# Patient Record
Sex: Male | Born: 1960 | Race: White | Hispanic: No | Marital: Married | Smoking: Never smoker
Health system: Southern US, Community
[De-identification: ages and names within clinical notes are randomized; demographics above are authoritative.]

## PROBLEM LIST (undated history)

## (undated) DIAGNOSIS — N200 Calculus of kidney: Secondary | ICD-10-CM

---

## 2005-10-11 ENCOUNTER — Ambulatory Visit: Payer: Self-pay | Admitting: Internal Medicine

## 2006-12-26 ENCOUNTER — Encounter: Payer: Self-pay | Admitting: Internal Medicine

## 2006-12-26 DIAGNOSIS — Z860100 Personal history of colon polyps, unspecified: Secondary | ICD-10-CM | POA: Insufficient documentation

## 2011-12-21 ENCOUNTER — Emergency Department (HOSPITAL_COMMUNITY)
Admission: EM | Admit: 2011-12-21 | Discharge: 2011-12-22 | Disposition: A | Discharge status: Home / Self Care | Payer: Self-pay | Attending: Emergency Medicine | Admitting: Emergency Medicine

## 2011-12-21 ENCOUNTER — Encounter (HOSPITAL_COMMUNITY): Payer: Self-pay | Admitting: *Deleted

## 2011-12-21 DIAGNOSIS — N23 Unspecified renal colic: Secondary | ICD-10-CM | POA: Insufficient documentation

## 2011-12-21 DIAGNOSIS — R112 Nausea with vomiting, unspecified: Secondary | ICD-10-CM | POA: Insufficient documentation

## 2011-12-21 DIAGNOSIS — Z87442 Personal history of urinary calculi: Secondary | ICD-10-CM | POA: Insufficient documentation

## 2011-12-21 DIAGNOSIS — R109 Unspecified abdominal pain: Secondary | ICD-10-CM | POA: Insufficient documentation

## 2011-12-21 HISTORY — DX: Calculus of kidney: N20.0

## 2011-12-21 LAB — CBC
HCT: 42.5 % (ref 39.0–52.0)
MCV: 85.9 fL (ref 78.0–100.0)
RBC: 4.95 MIL/uL (ref 4.22–5.81)
WBC: 18.6 10*3/uL — ABNORMAL HIGH (ref 4.0–10.5)

## 2011-12-21 LAB — URINALYSIS, ROUTINE W REFLEX MICROSCOPIC
Bilirubin Urine: NEGATIVE
Nitrite: NEGATIVE
Specific Gravity, Urine: 1.005 (ref 1.005–1.030)
pH: 6 (ref 5.0–8.0)

## 2011-12-21 LAB — COMPREHENSIVE METABOLIC PANEL
ALT: 32 U/L (ref 0–53)
Albumin: 4.1 g/dL (ref 3.5–5.2)
Alkaline Phosphatase: 82 U/L (ref 39–117)
BUN: 10 mg/dL (ref 6–23)
Chloride: 98 mEq/L (ref 96–112)
Potassium: 3.4 mEq/L — ABNORMAL LOW (ref 3.5–5.1)
Total Bilirubin: 1 mg/dL (ref 0.3–1.2)

## 2011-12-21 LAB — URINE MICROSCOPIC-ADD ON

## 2011-12-21 NOTE — ED Notes (Signed)
Pt self ambulated to restroom. When pt returned to room, pt stated that he passed blood in urine.

## 2011-12-21 NOTE — ED Notes (Signed)
Patient was diagnosed with kidney stone on Sunday in [place] and now he is complaining of lower abdominal pain.  Patient is also experiencing nausea and vomiting

## 2011-12-21 NOTE — ED Notes (Signed)
PT reported increased ABD. But Pt observed eating chips and drinking soda. Pt requested not to eat

## 2011-12-22 ENCOUNTER — Emergency Department (HOSPITAL_COMMUNITY): Payer: Self-pay

## 2011-12-22 ENCOUNTER — Encounter (HOSPITAL_COMMUNITY): Payer: Self-pay | Admitting: Radiology

## 2011-12-22 MED ORDER — SENNOSIDES-DOCUSATE SODIUM 8.6-50 MG PO TABS: 1.0000 | ORAL_TABLET | Freq: Every day | ORAL | Status: DC

## 2011-12-22 MED ORDER — ONDANSETRON HCL 4 MG/2ML IJ SOLN
4.0000 mg | Freq: Once | INTRAMUSCULAR | Status: AC
  Administered 2011-12-22: 4 mg via INTRAVENOUS
  Filled 2011-12-22: qty 2

## 2011-12-22 MED ORDER — SODIUM CHLORIDE 0.9 % IV BOLUS (SEPSIS)
1000.0000 mL | Freq: Once | INTRAVENOUS | Status: AC
  Administered 2011-12-22: 1000 mL via INTRAVENOUS

## 2011-12-22 MED ORDER — ONDANSETRON HCL 4 MG PO TABS: 4.0000 mg | ORAL_TABLET | Freq: Four times a day (QID) | ORAL | Status: DC

## 2011-12-22 MED ORDER — MORPHINE SULFATE 4 MG/ML IJ SOLN
4.0000 mg | Freq: Once | INTRAMUSCULAR | Status: AC
  Administered 2011-12-22: 4 mg via INTRAVENOUS
  Filled 2011-12-22: qty 1

## 2011-12-22 MED ORDER — TAMSULOSIN HCL 0.4 MG PO CAPS: 0.4000 mg | ORAL_CAPSULE | Freq: Every day | ORAL | Status: DC

## 2011-12-22 MED ORDER — DEXTROSE 5 % IV SOLN
1.0000 g | Freq: Once | INTRAVENOUS | Status: AC
  Administered 2011-12-22: 1 g via INTRAVENOUS
  Filled 2011-12-22: qty 10

## 2011-12-22 MED ORDER — CIPROFLOXACIN HCL 500 MG PO TABS: 500.0000 mg | ORAL_TABLET | Freq: Two times a day (BID) | ORAL | Status: DC

## 2011-12-22 MED ORDER — IOHEXOL 300 MG/ML  SOLN
100.0000 mL | Freq: Once | INTRAMUSCULAR | Status: AC | PRN
  Administered 2011-12-22: 100 mL via INTRAVENOUS

## 2011-12-22 NOTE — ED Provider Notes (Addendum)
History     CSN: [id number]  Arrival date & time 12/21/11  1613   First MD Initiated Contact with Patient 12/21/11 2303      Chief Complaint  Patient presents with  . Abdominal Pain    (Consider location/radiation/quality/duration/timing/severity/associated sxs/prior treatment) Patient is a 51 y.o. male presenting with abdominal pain. The history is provided by the patient.  Abdominal Pain The primary symptoms of the illness include abdominal pain, nausea and vomiting. Primary symptoms comment: constipation The current episode started more than 2 days ago. The onset of the illness was gradual. The problem has been gradually worsening.  The abdominal pain began more than 2 days ago. The pain came on gradually. The abdominal pain has been gradually worsening since its onset. The abdominal pain is generalized. The severity of the abdominal pain is 7/10. The abdominal pain is relieved by nothing. The abdominal pain is exacerbated by vomiting.  Nausea began today. The nausea is associated with eating.  The vomiting began today. The emesis contains stomach contents.  The patient has had a change in bowel habit. Additional symptoms associated with the illness include anorexia and constipation.    Past Medical History  Diagnosis Date  . Kidney stone     History reviewed. No pertinent past surgical history.  History reviewed. No pertinent family history.  History  Substance Use Topics  . Smoking status: Never Smoker   . Smokeless tobacco: Not on file  . Alcohol Use: No      Review of Systems  Gastrointestinal: Positive for nausea, vomiting, abdominal pain, constipation and anorexia.  All other systems reviewed and are negative.    Allergies  Review of patient's allergies indicates no known allergies.  Home Medications  No current outpatient prescriptions on file.  BP 132/73  Pulse 99  Temp 98.2 F (36.8 C) (Oral)  Resp 18  SpO2 97%  Physical Exam  Constitutional:  He is oriented to person, place, and time. He appears well-developed and well-nourished.  HENT:  Head: Normocephalic and atraumatic.  Eyes: Conjunctivae normal are normal. Pupils are equal, round, and reactive to light.  Neck: Normal range of motion. Neck supple.  Cardiovascular: Normal rate, regular rhythm, normal heart sounds and intact distal pulses.   Pulmonary/Chest: Effort normal and breath sounds normal.  Abdominal: Soft. Bowel sounds are normal.    Neurological: He is alert and oriented to person, place, and time.  Skin: Skin is warm and dry.  Psychiatric: He has a normal mood and affect. His behavior is normal. Judgment and thought content normal.    ED Course  Procedures (including critical care time)  Labs Reviewed  CBC - Abnormal; Notable for the following:    WBC 18.6 (*)     All other components within normal limits  URINALYSIS, ROUTINE W REFLEX MICROSCOPIC - Abnormal; Notable for the following:    Hgb urine dipstick TRACE (*)     All other components within normal limits  COMPREHENSIVE METABOLIC PANEL - Abnormal; Notable for the following:    Potassium 3.4 (*)     Glucose, Bld 119 (*)     GFR calc non Af Amer 66 (*)     GFR calc Af Amer 76 (*)     All other components within normal limits  URINE MICROSCOPIC-ADD ON   No results found.   No diagnosis found.    MDM  + abd pain.  18k wbcs,  Tachycardia,  Recent visit to [place] with dx of kidney stone.  Cont pain despite analgesia.  Will ct abd, analgesia, antiemetic,  reassess   Pt passed kidney stone in ed.  Noted leukocytosis and ct resutls.  Will abx here.  Dc to [facility] with urologist,  Ret new/worsening sxs    Everest Brod Lytle Michaels, MD 12/22/11 0043  Versie Soave Lytle Michaels, MD 12/22/11 [phone]

## 2011-12-22 NOTE — ED Notes (Signed)
Awaiting patient to arrive back in room from CT to give meds/fluids.

## 2011-12-22 NOTE — ED Notes (Signed)
Pt discharged in good condition with wife.

## 2011-12-23 LAB — URINE CULTURE
Colony Count: NO GROWTH
Culture: NO GROWTH

## 2012-11-03 ENCOUNTER — Encounter (HOSPITAL_COMMUNITY): Payer: Self-pay | Admitting: Emergency Medicine

## 2012-11-03 ENCOUNTER — Emergency Department (INDEPENDENT_AMBULATORY_CARE_PROVIDER_SITE_OTHER)
Admission: EM | Admit: 2012-11-03 | Discharge: 2012-11-03 | Disposition: A | Discharge status: Home / Self Care | Payer: Self-pay | Source: Home / Self Care

## 2012-11-03 DIAGNOSIS — S41112A Laceration without foreign body of left upper arm, initial encounter: Secondary | ICD-10-CM

## 2012-11-03 MED ORDER — TETANUS-DIPHTH-ACELL PERTUSSIS 5-2.5-18.5 LF-MCG/0.5 IM SUSP
INTRAMUSCULAR | Status: AC
  Filled 2012-11-03: qty 0.5

## 2012-11-03 MED ORDER — TETANUS-DIPHTH-ACELL PERTUSSIS 5-2.5-18.5 LF-MCG/0.5 IM SUSP
0.5000 mL | Freq: Once | INTRAMUSCULAR | Status: AC
  Administered 2012-11-03: 0.5 mL via INTRAMUSCULAR

## 2012-11-03 NOTE — ED Notes (Signed)
Laceration to left posterior about an hour ago while cutting leather. Wound is clean and bandaged no active bleeding.  Pt states not up to date on tetanus.

## 2012-11-03 NOTE — ED Provider Notes (Signed)
CSN: [id number]     Arrival date & time 11/03/12  1405 History     None    Chief Complaint  Patient presents with  . Extremity Laceration    left posterior forarm   (Consider location/radiation/quality/duration/timing/severity/associated sxs/prior Treatment) HPI Comments: 52 year old male presents for evaluation and treatment of a laceration sustained to his anterior left mid forearm approximately one and a half hours ago. 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 There was minimal blood loss which has been controlled with direct pressure. He denies any numbness distal to this injury. The pain is not significant at this time. His tetanus is not up-to-date. He denies history of poor wound healing or easy bleeding   Past Medical History  Diagnosis Date  . Kidney stone    History reviewed. No pertinent past surgical history. History reviewed. No pertinent family history. History  Substance Use Topics  . Smoking status: Never Smoker   . Smokeless tobacco: Not on file  . Alcohol Use: No    Review of Systems  Constitutional: Negative for fever, chills and fatigue.  Eyes: Negative for visual disturbance.  Respiratory: Negative for cough and shortness of breath.   Cardiovascular: Negative for chest pain.  Gastrointestinal: Negative for nausea, vomiting and abdominal pain.  Skin: Positive for wound. Negative for color change.  Neurological: Negative for dizziness, light-headedness and numbness.  Hematological: Does not bruise/bleed easily.    Allergies  Review of patient's allergies indicates no known allergies.  Home Medications   Current Outpatient Rx  Name  Route  Sig  Dispense  Refill  . ciprofloxacin (CIPRO) 500 MG tablet   Oral   Take 1 tablet (500 mg total) by mouth 2 (two) times daily.   20 tablet   0   . ondansetron (ZOFRAN) 4 MG tablet   Oral   Take 1 tablet (4 mg total) by mouth every 6 (six) hours.   12 tablet   0   .  senna-docusate (SENOKOT-S) 8.6-50 MG per tablet   Oral   Take 1 tablet by mouth daily.   30 tablet   0   . Tamsulosin HCl (FLOMAX) 0.4 MG CAPS   Oral   Take 1 capsule (0.4 mg total) by mouth daily.   15 capsule   0    BP 123/86  Pulse 80  Temp(Src) 98.3 F (36.8 C) (Oral)  Resp 12  SpO2 98% Physical Exam  Constitutional: He is oriented to person, place, and time. He appears well-developed and well-nourished. No distress.  HENT:  Head: Normocephalic and atraumatic.  Musculoskeletal: Normal range of motion.  Neurological: He is oriented to person, place, and time. Coordination normal.  Skin: Skin is warm and dry. Laceration (4 cm, full-thickness, across left forearm. Approximates easily.) noted. He is not diaphoretic.  Psychiatric: He has a normal mood and affect. Judgment normal.    ED Course   LACERATION REPAIR Date/Time: 11/03/2012 4:16 PM Performed by: Autumn Messing, H Authorized by: Autumn Messing, H Consent: Verbal consent obtained. Risks and benefits: risks, benefits and alternatives were discussed Consent given by: patient Patient understanding: patient states understanding of the procedure being performed Patient identity confirmed: verbally with patient Body area: upper extremity Location details: left lower arm Laceration length: 4 cm Foreign bodies: no foreign bodies Tendon involvement: none Nerve involvement: none Vascular damage: no Anesthesia: local infiltration Local anesthetic: lidocaine 2% with epinephrine Anesthetic total: 6 ml Patient sedated: no Preparation: Patient was  prepped and draped in the usual sterile fashion. Irrigation solution: saline Irrigation method: syringe Amount of cleaning: standard Debridement: none Degree of undermining: none Skin closure: 4-0 Prolene Number of sutures: 6 Technique: simple Approximation: close Approximation difficulty: simple Dressing: antibiotic ointment and 4x4 sterile gauze Patient tolerance:  Patient tolerated the procedure well with no immediate complications.   (including critical care time)  Labs Reviewed - No data to display No results found. 1. Laceration of arm, left, initial encounter     MDM  The patient tolerated the procedure well. Good wound approximation and hemostasis. Followup in 10 days for suture removal. Return precautions discussed with patient    Graylon Good, PA-C 11/03/12 [phone]

## 2012-11-06 NOTE — ED Provider Notes (Signed)
Medical screening examination/treatment/procedure(s) were performed by resident physician or non-physician practitioner and as supervising physician I was immediately available for consultation/collaboration.   Barkley Bruns MD.   Linna Hoff, MD 11/06/12 [phone]

## 2017-09-28 ENCOUNTER — Emergency Department (HOSPITAL_COMMUNITY)
Admission: EM | Admit: 2017-09-28 | Discharge: 2017-09-28 | Disposition: A | Discharge status: Home / Self Care | Payer: BLUE CROSS/BLUE SHIELD | Attending: Emergency Medicine | Admitting: Emergency Medicine

## 2017-09-28 ENCOUNTER — Encounter (HOSPITAL_COMMUNITY): Payer: Self-pay | Admitting: Emergency Medicine

## 2017-09-28 DIAGNOSIS — Y999 Unspecified external cause status: Secondary | ICD-10-CM | POA: Diagnosis not present

## 2017-09-28 DIAGNOSIS — Y939 Activity, unspecified: Secondary | ICD-10-CM | POA: Insufficient documentation

## 2017-09-28 DIAGNOSIS — Y929 Unspecified place or not applicable: Secondary | ICD-10-CM | POA: Diagnosis not present

## 2017-09-28 DIAGNOSIS — Z79899 Other long term (current) drug therapy: Secondary | ICD-10-CM | POA: Insufficient documentation

## 2017-09-28 DIAGNOSIS — S70362A Insect bite (nonvenomous), left thigh, initial encounter: Secondary | ICD-10-CM | POA: Diagnosis not present

## 2017-09-28 DIAGNOSIS — S79922A Unspecified injury of left thigh, initial encounter: Secondary | ICD-10-CM | POA: Diagnosis present

## 2017-09-28 DIAGNOSIS — W57XXXA Bitten or stung by nonvenomous insect and other nonvenomous arthropods, initial encounter: Secondary | ICD-10-CM | POA: Diagnosis not present

## 2017-09-28 MED ORDER — DOXYCYCLINE HYCLATE 100 MG PO TABS
200.0000 mg | ORAL_TABLET | Freq: Once | ORAL | Status: AC
  Administered 2017-09-28: 200 mg via ORAL
  Filled 2017-09-28: qty 2

## 2017-09-28 NOTE — ED Provider Notes (Signed)
[facility] EMERGENCY DEPARTMENT Provider Note   CSN: [id number] Arrival date & time: 09/28/17  0234     History   Chief Complaint Chief Complaint  Patient presents with  . Tick Removal    HPI Neil Padilla is a 57 y.o. male.  The history is provided by the patient. No language interpreter was used.     Patient reports he discover a tick embedded on his left upper thigh this evening.  He did not know how long the tick has been on his skin but did recall mowing the yard 5 days ago.  He denies any associated pain.  Denies fever, headache, joint pain or rash.  No specific treatment tried.  He did not attempt to remove the tick.  Past Medical History:  Diagnosis Date  . Kidney stone     Patient Active Problem List   Diagnosis Date Noted  . COLONIC POLYPS, HX OF 12/26/2006    History reviewed. No pertinent surgical history.      Home Medications    Prior to Admission medications   Medication Sig Start Date End Date Taking? Authorizing Provider  ciprofloxacin (CIPRO) 500 MG tablet Take 1 tablet (500 mg total) by mouth 2 (two) times daily. 12/22/11   Mendel Ryder, MD  ondansetron (ZOFRAN) 4 MG tablet Take 1 tablet (4 mg total) by mouth every 6 (six) hours. 12/22/11   Mendel Ryder, MD  senna-docusate (SENOKOT-S) 8.6-50 MG per tablet Take 1 tablet by mouth daily. 12/22/11   Mendel Ryder, MD  Tamsulosin HCl (FLOMAX) 0.4 MG CAPS Take 1 capsule (0.4 mg total) by mouth daily. 12/22/11   Mendel Ryder, MD    Family History No family history on file.  Social History Social History   Tobacco Use  . Smoking status: Never Smoker  Substance Use Topics  . Alcohol use: No  . Drug use: No     Allergies   Patient has no known allergies.   Review of Systems Review of Systems  Constitutional: Negative for fever.  Skin: Negative for rash.  Neurological: Negative for headaches.     Physical Exam Updated Vital Signs BP (!) 139/103 (BP  Location: Right Arm)   Pulse 87   Temp 97.6 F (36.4 C) (Oral)   Resp 18   Ht 5\' 7"  (1.702 m)   Wt 71.7 kg (158 lb)   SpO2 100%   BMI 24.75 kg/m   Physical Exam  Constitutional: He appears well-developed and well-nourished. No distress.  HENT:  Head: Atraumatic.  Eyes: Conjunctivae are normal.  Neck: Neck supple.  Neurological: He is alert.  Skin: No rash noted.  Left upper thigh: There is a small tick embedded in the anterior mid thigh with very faint surrounding skin erythema less than 1 cm in diameter.  The tick is not engorged.  Full examination of the skin revealed no evidence of other tick.  Psychiatric: He has a normal mood and affect.  Nursing note and vitals reviewed.    ED Treatments / Results  Labs (all labs ordered are listed, but only abnormal results are displayed) Labs Reviewed - No data to display  EKG None  Radiology No results found.  Procedures Procedures (including critical care time)  Medications Ordered in ED Medications  doxycycline (VIBRA-TABS) tablet 200 mg (has no administration in time range)     Initial Impression / Assessment and Plan / ED Course  I have reviewed the triage vital signs and the nursing notes.  Pertinent labs &  imaging results that were available during my care of the patient were reviewed by me and considered in my medical decision making (see chart for details).     BP (!) 139/103 (BP Location: Right Arm)   Pulse 87   Temp 97.6 F (36.4 C) (Oral)   Resp 18   Ht 5\' 7"  (1.702 m)   Wt 71.7 kg (158 lb)   SpO2 100%   BMI 24.75 kg/m    Final Clinical Impressions(s) / ED Diagnoses   Final diagnoses:  Tick bite with subsequent removal of tick    ED Discharge Orders    None     3:48 AM Patient found a tick embedded on his left upper thigh today.  The tick may have been on the skin for several days.  It does not appears to be engorged.  He does not have any systemic symptoms. I performed a thorough skin  check without other embedded tick. I was able to remove the tick using a forcep without any difficulty.  Given concern of potential tickborne illness, patient receive a one-time dose of doxycycline 200 mg.  Return precautions discussed.   Fayrene Helperran, Mikhayla Phillis, PA-C 09/28/17 [phone]    Shon BatonHorton, Courtney F, MD 09/28/17 [phone]

## 2017-09-28 NOTE — ED Triage Notes (Signed)
Pt states he has tick on his L upper thigh, unsure of how long it has been there.

## 2017-11-27 ENCOUNTER — Encounter (HOSPITAL_COMMUNITY): Payer: Self-pay | Admitting: Emergency Medicine

## 2017-11-27 ENCOUNTER — Other Ambulatory Visit: Payer: Self-pay

## 2017-11-27 ENCOUNTER — Emergency Department (HOSPITAL_COMMUNITY): Payer: BLUE CROSS/BLUE SHIELD

## 2017-11-27 ENCOUNTER — Emergency Department (HOSPITAL_COMMUNITY)
Admission: EM | Admit: 2017-11-27 | Discharge: 2017-11-28 | Disposition: A | Discharge status: Home / Self Care | Payer: BLUE CROSS/BLUE SHIELD | Source: Home / Self Care | Attending: Emergency Medicine | Admitting: Emergency Medicine

## 2017-11-27 ENCOUNTER — Encounter (HOSPITAL_COMMUNITY): Payer: Self-pay

## 2017-11-27 ENCOUNTER — Emergency Department (HOSPITAL_COMMUNITY)
Admission: EM | Admit: 2017-11-27 | Discharge: 2017-11-27 | Disposition: A | Discharge status: Home / Self Care | Payer: BLUE CROSS/BLUE SHIELD | Attending: Emergency Medicine | Admitting: Emergency Medicine

## 2017-11-27 DIAGNOSIS — K644 Residual hemorrhoidal skin tags: Secondary | ICD-10-CM | POA: Diagnosis not present

## 2017-11-27 DIAGNOSIS — K6289 Other specified diseases of anus and rectum: Secondary | ICD-10-CM | POA: Diagnosis present

## 2017-11-27 DIAGNOSIS — R188 Other ascites: Secondary | ICD-10-CM

## 2017-11-27 DIAGNOSIS — K611 Rectal abscess: Secondary | ICD-10-CM

## 2017-11-27 LAB — CBC WITH DIFFERENTIAL/PLATELET
Abs Immature Granulocytes: 0.1 10*3/uL (ref 0.0–0.1)
BASOS PCT: 0 %
Basophils Absolute: 0.1 10*3/uL (ref 0.0–0.1)
Basophils Absolute: 0 10*3/uL (ref 0.0–0.1)
Basophils Relative: 1 %
Eosinophils Absolute: 0.5 10*3/uL (ref 0.0–0.7)
Eosinophils Absolute: 0.5 10*3/uL (ref 0.0–0.7)
Eosinophils Relative: 4 %
Eosinophils Relative: 3 %
HCT: 42 % (ref 39.0–52.0)
HEMATOCRIT: 41.7 % (ref 39.0–52.0)
Hemoglobin: 13.8 g/dL (ref 13.0–17.0)
Hemoglobin: 14.4 g/dL (ref 13.0–17.0)
Immature Granulocytes: 1 %
LYMPHS ABS: 2.4 10*3/uL (ref 0.7–4.0)
Lymphocytes Relative: 17 %
Lymphocytes Relative: 16 %
Lymphs Abs: 2.2 10*3/uL (ref 0.7–4.0)
MCH: 29.6 pg (ref 26.0–34.0)
MCH: 30 pg (ref 26.0–34.0)
MCHC: 32.9 g/dL (ref 30.0–36.0)
MCHC: 34.5 g/dL (ref 30.0–36.0)
MCV: 90.1 fL (ref 78.0–100.0)
MCV: 86.9 fL (ref 78.0–100.0)
MONO ABS: 1.6 10*3/uL — AB (ref 0.1–1.0)
MONOS PCT: 10 %
Monocytes Absolute: 1.4 10*3/uL — ABNORMAL HIGH (ref 0.1–1.0)
Monocytes Relative: 10 %
NEUTROS ABS: 10.6 10*3/uL — AB (ref 1.7–7.7)
Neutro Abs: 9 10*3/uL — ABNORMAL HIGH (ref 1.7–7.7)
Neutrophils Relative %: 67 %
Neutrophils Relative %: 71 %
PLATELETS: 341 10*3/uL (ref 150–400)
Platelets: 306 10*3/uL (ref 150–400)
RBC: 4.66 MIL/uL (ref 4.22–5.81)
RBC: 4.8 MIL/uL (ref 4.22–5.81)
RDW: 13.2 % (ref 11.5–15.5)
RDW: 13.5 % (ref 11.5–15.5)
WBC: 13.2 10*3/uL — ABNORMAL HIGH (ref 4.0–10.5)
WBC: 15.1 10*3/uL — ABNORMAL HIGH (ref 4.0–10.5)

## 2017-11-27 LAB — BASIC METABOLIC PANEL
Anion gap: 8 (ref 5–15)
BUN: 9 mg/dL (ref 6–20)
CO2: 24 mmol/L (ref 22–32)
Calcium: 8.7 mg/dL — ABNORMAL LOW (ref 8.9–10.3)
Chloride: 107 mmol/L (ref 98–111)
Creatinine, Ser: 0.7 mg/dL (ref 0.61–1.24)
GFR calc Af Amer: >60 mL/min (ref >60)
GFR calc non Af Amer: >60 mL/min (ref >60)
Glucose, Bld: 88 mg/dL (ref 70–99)
Potassium: 3.5 mmol/L (ref 3.5–5.1)
Sodium: 139 mmol/L (ref 135–145)

## 2017-11-27 LAB — I-STAT CG4 LACTIC ACID, ED: Lactic Acid, Venous: 1.25 mmol/L (ref 0.5–1.9)

## 2017-11-27 MED ORDER — IOPAMIDOL (ISOVUE-300) INJECTION 61%
INTRAVENOUS | Status: AC
  Filled 2017-11-27: qty 100

## 2017-11-27 MED ORDER — IOPAMIDOL (ISOVUE-300) INJECTION 61%
100.0000 mL | Freq: Once | INTRAVENOUS | Status: AC | PRN
  Administered 2017-11-27: 100 mL via INTRAVENOUS

## 2017-11-27 MED ORDER — SULFAMETHOXAZOLE-TRIMETHOPRIM 800-160 MG PO TABS: 2.0000 | ORAL_TABLET | Freq: Two times a day (BID) | ORAL | 0 refills | Status: DC

## 2017-11-27 MED ORDER — HYDROCODONE-ACETAMINOPHEN 5-325 MG PO TABS
1.0000 | ORAL_TABLET | Freq: Once | ORAL | Status: AC
  Administered 2017-11-27: 1 via ORAL
  Filled 2017-11-27: qty 1

## 2017-11-27 MED ORDER — HYDROCODONE-ACETAMINOPHEN 5-325 MG PO TABS: 1.0000 | ORAL_TABLET | Freq: Four times a day (QID) | ORAL | 0 refills | Status: DC | PRN

## 2017-11-27 NOTE — ED Notes (Signed)
Patient verbalizes understanding of discharge instructions. Opportunity for questioning and answers were provided. Armband removed by staff, pt discharged from ED.  

## 2017-11-27 NOTE — ED Notes (Signed)
Patient transported to CT 

## 2017-11-27 NOTE — ED Triage Notes (Signed)
Pt states hx of hemorrhoids that required surgery. States for 3 days he has had a swollen hemorrhoid, causing discomfort with movement. Denies any rectal bleeding.

## 2017-11-27 NOTE — ED Provider Notes (Signed)
[facility] EMERGENCY DEPARTMENT Provider Note   CSN: [id number] Arrival date & time: 11/27/17  0844     History   Chief Complaint Chief Complaint  Patient presents with  . Hemorrhoids    HPI Neil Padilla is a 57 y.o. male.  HPI Patient presents to the emergency department with pain in the anal area from what he feels like the hemorrhoid.  The patient states there is discomfort with certain positions and palpation of the area.  The patient states that he has not had any other symptoms no rectal bleeding.  Patient states he did take an oxycodone for pain last night which seemed to help.  The patient denies chest pain, shortness of breath, headache,blurred vision, neck pain, fever, cough, weakness, numbness, dizziness, anorexia, edema, abdominal pain, nausea, vomiting, diarrhea, rash, back pain, dysuria, hematemesis, bloody stool, near syncope, or syncope. Past Medical History:  Diagnosis Date  . Kidney stone     Patient Active Problem List   Diagnosis Date Noted  . COLONIC POLYPS, HX OF 12/26/2006    No past surgical history on file.      Home Medications    Prior to Admission medications   Medication Sig Start Date End Date Taking? Authorizing Provider  acetaminophen (TYLENOL) 500 MG tablet Take 1,000 mg by mouth every 6 (six) hours as needed for mild pain.   Yes [provider]  ibuprofen (ADVIL,MOTRIN) 200 MG tablet Take 400 mg by mouth every 6 (six) hours as needed for mild pain.   Yes [provider]  HYDROcodone-acetaminophen (NORCO/VICODIN) 5-325 MG tablet Take 1 tablet by mouth every 6 (six) hours as needed for moderate pain. 11/27/17   Babbie Dondlinger, Cristal Deer, PA-C  ondansetron (ZOFRAN) 4 MG tablet Take 1 tablet (4 mg total) by mouth every 6 (six) hours. Patient not taking: Reported on 11/27/2017 12/22/11   Mendel Ryder, MD  senna-docusate (SENOKOT-S) 8.6-50 MG per tablet Take 1 tablet by mouth daily. Patient not taking: Reported on  11/27/2017 12/22/11   Mendel Ryder, MD  sulfamethoxazole-trimethoprim (BACTRIM DS,SEPTRA DS) 800-160 MG tablet Take 2 tablets by mouth 2 (two) times daily for 7 days. 11/27/17 12/04/17  Ysabel Stankovich, Cristal Deer, PA-C  Tamsulosin HCl (FLOMAX) 0.4 MG CAPS Take 1 capsule (0.4 mg total) by mouth daily. Patient not taking: Reported on 11/27/2017 12/22/11   Mendel Ryder, MD    Family History No family history on file.  Social History Social History   Tobacco Use  . Smoking status: Never Smoker  Substance Use Topics  . Alcohol use: No  . Drug use: No     Allergies   Patient has no known allergies.   Review of Systems Review of Systems All other systems negative except as documented in the HPI. All pertinent positives and negatives as reviewed in the HPI.  Physical Exam Updated Vital Signs BP 118/88   Pulse 82   Temp 98 F (36.7 C)   Resp 14   SpO2 97%   Physical Exam  Constitutional: He is oriented to person, place, and time. He appears well-developed and well-nourished. No distress.  HENT:  Head: Normocephalic and atraumatic.  Mouth/Throat: Oropharynx is clear and moist.  Eyes: Pupils are equal, round, and reactive to light.  Neck: Normal range of motion. Neck supple.  Cardiovascular: Normal rate, regular rhythm and normal heart sounds. Exam reveals no gallop and no friction rub.  No murmur heard. Pulmonary/Chest: Effort normal and breath sounds normal. No respiratory distress. He has no wheezes.  Abdominal:  Soft. Bowel sounds are normal. He exhibits no distension. There is no tenderness.  Genitourinary:     Neurological: He is alert and oriented to person, place, and time. He exhibits normal muscle tone. Coordination normal.  Skin: Skin is warm and dry. Capillary refill takes less than 2 seconds. No rash noted. No erythema.  Psychiatric: He has a normal mood and affect. His behavior is normal.  Nursing note and vitals reviewed.    ED Treatments / Results  Labs (all  labs ordered are listed, but only abnormal results are displayed) Labs Reviewed  BASIC METABOLIC PANEL - Abnormal; Notable for the following components:      Result Value   Calcium 8.7 (*)    All other components within normal limits  CBC WITH DIFFERENTIAL/PLATELET - Abnormal; Notable for the following components:   WBC 13.2 (*)    Neutro Abs 9.0 (*)    Monocytes Absolute 1.4 (*)    All other components within normal limits    EKG None  Radiology Ct Pelvis W Contrast  Result Date: 11/27/2017 CLINICAL DATA:  Pt states hx of hemorrhoids that required surgery. States for 3 days he has had a swollen hemorrhoid, causing discomfort with movement. Denies any rectal bleeding. EXAM: CT PELVIS WITH CONTRAST TECHNIQUE: Multidetector CT imaging of the pelvis was performed using the standard protocol following the bolus administration of intravenous contrast. CONTRAST:  <See Chart> ISOVUE-300 IOPAMIDOL (ISOVUE-300) INJECTION 61% COMPARISON:  12/22/2011 FINDINGS: Urinary Tract: Visualized portion of the kidneys is unremarkable. Ureters are normal in appearance. The urinary bladder and visualized urethra are unremarkable in appearance. Bowel: There is moderate stool burden within nondilated loops of colon. No significant colonic inflammation. RIGHT inguinal hernia contains nondilated small bowel. LEFT inguinal hernia contains nondilated small bowel loops. No evidence for obstruction related to hernia. Vascular/Lymphatic: No pathologically enlarged lymph nodes. No significant vascular abnormality seen. Reproductive:  No mass or other significant abnormality Other: Along the LEFT LATERAL wall of the rectum, there is an oval low-attenuation fluid attenuation mass which measures 1.2 x 5.6 x 1.5 centimeters. There is associated peripheral enhancement. Musculoskeletal: No suspicious bone lesions identified. IMPRESSION: Oval enhancing fluid collection in the LEFT LATERAL wall of the rectum. Considerations include  thrombosed hemorrhoid or abscess. 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 Electronically Signed   By: Norva Pavlov M.D.   On: 11/27/2017 13:48    Procedures Procedures (including critical care time)  Medications Ordered in ED Medications  HYDROcodone-acetaminophen (NORCO/VICODIN) 5-325 MG per tablet 1 tablet (has no administration in time range)  iopamidol (ISOVUE-300) 61 % injection 100 mL (100 mLs Intravenous Contrast Given 11/27/17 1307)     Initial Impression / Assessment and Plan / ED Course  I have reviewed the triage vital signs and the nursing notes.  Pertinent labs & imaging results that were available during my care of the patient were reviewed by me and considered in my medical decision making (see chart for details).     CT scan showed a fluid collection in that region and I spoke with the general surgeon Dr. Dwain Sarna who reviewed the results as well.  He felt that the patient needs to use warm bath soaks and then placed on antibiotics and pain control.  He will need to follow-up in their office per his report.  The patient is advised for close return precautions with fever nausea vomiting or any worsening symptoms.  This area could still represent an infectious source even though he does  not have fever or other signs of infection at this time.  Patient's white count was noted to be elevated and this was conveyed during the consultation as well.  Patient is advised that this could still represent infection and close monitoring of his condition will need to be done and return for worsening symptoms. Final Clinical Impressions(s) / ED Diagnoses   Final diagnoses:  Rectal pain  Pelvic fluid collection    ED Discharge Orders         Ordered    HYDROcodone-acetaminophen (NORCO/VICODIN) 5-325 MG tablet  Every 6 hours PRN     11/27/17 1453    sulfamethoxazole-trimethoprim (BACTRIM DS,SEPTRA DS) 800-160 MG tablet  2 times daily     11/27/17  1453           Charlestine Night, PA-C 11/27/17 1643    Melene Plan, DO 11/28/17 [phone]

## 2017-11-27 NOTE — Discharge Instructions (Addendum)
Return here for any worsening in your condition. Call the surgeons office tomorrow for an appointment and if closed call first thing Tuesday for a same day follow up. If you notice fever, vomiting, or other worsening in your condition return here.

## 2017-11-27 NOTE — ED Triage Notes (Signed)
Pt arrived from home via [facility] with complaints of pain on his left buttocks due to an abscess, was seen by PCP today and given antibiotics (sulfamethoxazole). Fever spiked later this afternoon, at home 101.2. Pt reporting chills and nausea. States SOB due to pain, given of fentanyl and 700cc normal saline.

## 2017-11-27 NOTE — ED Notes (Signed)
Bed: TD97 Expected date:  Expected time:  Means of arrival:  Comments: EMS 57 yo male pain due to anal abscess temp 101.2

## 2017-11-28 LAB — URINALYSIS, ROUTINE W REFLEX MICROSCOPIC
Bilirubin Urine: NEGATIVE
Glucose, UA: NEGATIVE mg/dL
HGB URINE DIPSTICK: NEGATIVE
Ketones, ur: NEGATIVE mg/dL
LEUKOCYTES UA: NEGATIVE
NITRITE: NEGATIVE
PROTEIN: NEGATIVE mg/dL
Specific Gravity, Urine: 1.006 (ref 1.005–1.030)
pH: 8 (ref 5.0–8.0)

## 2017-11-28 LAB — COMPREHENSIVE METABOLIC PANEL
ALBUMIN: 4.2 g/dL (ref 3.5–5.0)
ALK PHOS: 57 U/L (ref 38–126)
ALT: 23 U/L (ref 0–44)
ANION GAP: 10 (ref 5–15)
AST: 19 U/L (ref 15–41)
BUN: 9 mg/dL (ref 6–20)
CALCIUM: 8.9 mg/dL (ref 8.9–10.3)
CO2: 22 mmol/L (ref 22–32)
Chloride: 107 mmol/L (ref 98–111)
Creatinine, Ser: 0.72 mg/dL (ref 0.61–1.24)
GFR calc Af Amer: >60 mL/min (ref >60)
GFR calc non Af Amer: >60 mL/min (ref >60)
GLUCOSE: 102 mg/dL — AB (ref 70–99)
POTASSIUM: 3.3 mmol/L — AB (ref 3.5–5.1)
Sodium: 139 mmol/L (ref 135–145)
Total Bilirubin: 1.1 mg/dL (ref 0.3–1.2)
Total Protein: 6.8 g/dL (ref 6.5–8.1)

## 2017-11-28 LAB — PROTIME-INR
INR: 1.1
Prothrombin Time: 14.1 seconds (ref 11.4–15.2)

## 2017-11-28 LAB — I-STAT CG4 LACTIC ACID, ED: LACTIC ACID, VENOUS: 0.74 mmol/L (ref 0.5–1.9)

## 2017-11-28 MED ORDER — SODIUM CHLORIDE 0.9 % IV SOLN
Freq: Once | INTRAVENOUS | Status: AC
  Administered 2017-11-28: 01:00:00 via INTRAVENOUS

## 2017-11-28 MED ORDER — HYDROMORPHONE HCL 1 MG/ML IJ SOLN
1.0000 mg | Freq: Once | INTRAMUSCULAR | Status: AC
  Administered 2017-11-28: 1 mg via INTRAVENOUS
  Filled 2017-11-28: qty 1

## 2017-11-28 MED ORDER — ACETAMINOPHEN 325 MG PO TABS
650.0000 mg | ORAL_TABLET | Freq: Once | ORAL | Status: AC
  Administered 2017-11-28: 650 mg via ORAL
  Filled 2017-11-28: qty 2

## 2017-11-28 MED ORDER — LIDOCAINE-EPINEPHRINE (PF) 2 %-1:200000 IJ SOLN
20.0000 mL | Freq: Once | INTRAMUSCULAR | Status: AC
  Administered 2017-11-28: 20 mL
  Filled 2017-11-28: qty 20

## 2017-11-28 MED ORDER — AMOXICILLIN-POT CLAVULANATE 875-125 MG PO TABS: 1.0000 | ORAL_TABLET | Freq: Two times a day (BID) | ORAL | 0 refills | Status: DC

## 2017-11-28 MED ORDER — PIPERACILLIN-TAZOBACTAM 3.375 G IVPB 30 MIN
3.3750 g | Freq: Once | INTRAVENOUS | Status: AC
  Administered 2017-11-28: 3.375 g via INTRAVENOUS
  Filled 2017-11-28: qty 50

## 2017-11-28 NOTE — Progress Notes (Signed)
A consult was received from an ED physician for zosyn per pharmacy dosing.  The patient's profile has been reviewed for ht/wt/allergies/indication/available labs.   A one time order has been placed for zosyn 3.375gm iv x1.  Further antibiotics/pharmacy consults should be ordered by admitting physician if indicated.                       Thank you, Aleene Davidson Crowford 11/28/2017  12:47 AM

## 2017-11-28 NOTE — Discharge Instructions (Signed)
You were seen in the ER for continued, worsening perianal/perirectal pain.  Dr. Daphine Deutscher was able to drain the abscess.  We will switch your antibiotic to Augmentin.  Continue taking Vicodin for pain.  Continue doing warm sits baths.  Follow-up in the office with Dr. Daphine Deutscher within 1 week for recheck.  Continue checking her temperature.  You may have a fever for the next 24 to 48 hours but this should stop after 3 full days of antibiotics.  Return to the ER for worsening pain, persistent fevers.

## 2017-11-28 NOTE — ED Provider Notes (Signed)
[facility] DEPT Provider Note   CSN: [id number] Arrival date & time: 11/27/17  2253     History   Chief Complaint Chief Complaint  Patient presents with  . Abscess    HPI Neil Padilla is a 57 y.o. male is here for evaluation of anal pain, severe, constant.  Onset 3 days ago, worsening.  Associated with fevers up to 101.  He was seen in the ER less than 24 hours ago and was told he had an abscess on CT scan, he was discharged with Vicodin and Bactrim.  He took 1 dose of Bactrim at 5 PM today and Vicodin at 8 PM with no relief.  Has done multiple warm baths/soaks.  Returns to the ER because the pain is significantly worse, he cannot walk, sit, have a bowel movement and has continued to have fevers.  Denies any drainage, bleeding.  Has had hemorrhoid surgery in the past.  Colonoscopy July 1 with polyps and internal hemorrhoids.  Aggravated with any movement, palpation.  No alleviating factors.  HPI  Past Medical History:  Diagnosis Date  . Kidney stone     Patient Active Problem List   Diagnosis Date Noted  . COLONIC POLYPS, HX OF 12/26/2006    History reviewed. No pertinent surgical history.      Home Medications    Prior to Admission medications   Medication Sig Start Date End Date Taking? Authorizing Provider  acetaminophen (TYLENOL) 500 MG tablet Take 1,000 mg by mouth every 6 (six) hours as needed for mild pain.    [provider]  amoxicillin-clavulanate (AUGMENTIN) 875-125 MG tablet Take 1 tablet by mouth every 12 (twelve) hours. 11/28/17   Kinnie Feil, PA-C  HYDROcodone-acetaminophen (NORCO/VICODIN) 5-325 MG tablet Take 1 tablet by mouth every 6 (six) hours as needed for moderate pain. 11/27/17   Lawyer, Harrell Gave, PA-C  ibuprofen (ADVIL,MOTRIN) 200 MG tablet Take 400 mg by mouth every 6 (six) hours as needed for mild pain.    [provider]  ondansetron (ZOFRAN) 4 MG tablet Take 1 tablet (4 mg total) by mouth every  6 (six) hours. Patient not taking: Reported on 11/27/2017 12/22/11   Helane Rima, MD  senna-docusate (SENOKOT-S) 8.6-50 MG per tablet Take 1 tablet by mouth daily. Patient not taking: Reported on 11/27/2017 12/22/11   Helane Rima, MD  Tamsulosin HCl (FLOMAX) 0.4 MG CAPS Take 1 capsule (0.4 mg total) by mouth daily. Patient not taking: Reported on 11/27/2017 12/22/11   Helane Rima, MD    Family History No family history on file.  Social History Social History   Tobacco Use  . Smoking status: Never Smoker  Substance Use Topics  . Alcohol use: No  . Drug use: No     Allergies   Patient has no known allergies.   Review of Systems Review of Systems  Constitutional: Positive for chills and fever.  Genitourinary:       Anal pain   All other systems reviewed and are negative.    Physical Exam Updated Vital Signs BP 117/79   Pulse 73   Temp 99.4 F (37.4 C) (Oral)   Resp (!) 9   Ht 5' 9"  (1.753 m)   Wt 70.8 kg   SpO2 94%   BMI 23.04 kg/m   Physical Exam  Constitutional: He is oriented to person, place, and time. He appears well-developed and well-nourished. No distress.  NAD.  HENT:  Head: Normocephalic and atraumatic.  Right Ear: External ear normal.  Left Ear: External ear normal.  Nose: Nose normal.  Eyes: Conjunctivae and EOM are normal. No scleral icterus.  Neck: Normal range of motion. Neck supple.  Cardiovascular: Normal rate, regular rhythm, normal heart sounds and intact distal pulses.  No murmur heard. Pulmonary/Chest: Effort normal and breath sounds normal. He has no wheezes.  Genitourinary: Rectal exam shows tenderness.     Genitourinary Comments: Approximately 1 x 3 cm oval area of edema, fluctuance, tenderness.  External hemorrhoid at 9:00 noted, nontender, without bleeding.  Internal exam reveals mild fluctuance and tenderness to the left lateral wall of rectal vault.  No bleeding.  Good rectal tone.  Musculoskeletal: Normal range of  motion. He exhibits no deformity.  Neurological: He is alert and oriented to person, place, and time.  Skin: Skin is warm and dry. Capillary refill takes less than 2 seconds.  Psychiatric: He has a normal mood and affect. His behavior is normal. Judgment and thought content normal.  Nursing note and vitals reviewed.    ED Treatments / Results  Labs (all labs ordered are listed, but only abnormal results are displayed) Labs Reviewed  COMPREHENSIVE METABOLIC PANEL - Abnormal; Notable for the following components:      Result Value   Potassium 3.3 (*)    Glucose, Bld 102 (*)    All other components within normal limits  CBC WITH DIFFERENTIAL/PLATELET - Abnormal; Notable for the following components:   WBC 15.1 (*)    Neutro Abs 10.6 (*)    Monocytes Absolute 1.6 (*)    All other components within normal limits  URINALYSIS, ROUTINE W REFLEX MICROSCOPIC - Abnormal; Notable for the following components:   Color, Urine STRAW (*)    All other components within normal limits  CULTURE, BLOOD (ROUTINE X 2)  CULTURE, BLOOD (ROUTINE X 2)  PROTIME-INR  I-STAT CG4 LACTIC ACID, ED  I-STAT CG4 LACTIC ACID, ED    EKG None  Radiology Dg Chest 2 View  Result Date: 11/28/2017 CLINICAL DATA:  57 year old male with fever. EXAM: CHEST - 2 VIEW COMPARISON:  None. FINDINGS: There is no focal consolidation, pleural effusion, or pneumothorax. The cardiac silhouette is within normal limits. Mild midthoracic compression deformity, age indeterminate, possibly chronic. Clinical correlation is recommended. IMPRESSION: No active cardiopulmonary disease. Electronically Signed   By: Anner Crete M.D.   On: 11/28/2017 00:09   Ct Pelvis W Contrast  Result Date: 11/27/2017 CLINICAL DATA:  Pt states hx of hemorrhoids that required surgery. States for 3 days he has had a swollen hemorrhoid, causing discomfort with movement. Denies any rectal bleeding. EXAM: CT PELVIS WITH CONTRAST TECHNIQUE: Multidetector CT  imaging of the pelvis was performed using the standard protocol following the bolus administration of intravenous contrast. CONTRAST:  <See Chart> ISOVUE-300 IOPAMIDOL (ISOVUE-300) INJECTION 61% COMPARISON:  12/22/2011 FINDINGS: Urinary Tract: Visualized portion of the kidneys is unremarkable. Ureters are normal in appearance. The urinary bladder and visualized urethra are unremarkable in appearance. Bowel: There is moderate stool burden within nondilated loops of colon. No significant colonic inflammation. RIGHT inguinal hernia contains nondilated small bowel. LEFT inguinal hernia contains nondilated small bowel loops. No evidence for obstruction related to hernia. Vascular/Lymphatic: No pathologically enlarged lymph nodes. No significant vascular abnormality seen. Reproductive:  No mass or other significant abnormality Other: Along the LEFT LATERAL wall of the rectum, there is an oval low-attenuation fluid attenuation mass which measures 1.2 x 5.6 x 1.5 centimeters. There is associated peripheral enhancement. Musculoskeletal: No suspicious bone lesions identified. IMPRESSION: Oval enhancing  fluid collection in the LEFT LATERAL wall of the rectum. Considerations include thrombosed hemorrhoid or abscess. 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 Electronically Signed   By: Nolon Nations M.D.   On: 11/27/2017 13:48    Procedures Procedures (including critical care time)  Medications Ordered in ED Medications  0.9 %  sodium chloride infusion ( Intravenous Stopped 11/28/17 0156)  HYDROmorphone (DILAUDID) injection 1 mg (1 mg Intravenous Given 11/28/17 0047)  lidocaine-EPINEPHrine (XYLOCAINE W/EPI) 2 %-1:200000 (PF) injection 20 mL (20 mLs Infiltration Given 11/28/17 0048)  acetaminophen (TYLENOL) tablet 650 mg (650 mg Oral Given 11/28/17 0048)  piperacillin-tazobactam (ZOSYN) IVPB 3.375 g (0 g Intravenous Stopped 11/28/17 0156)     Initial Impression / Assessment and Plan / ED  Course  I have reviewed the triage vital signs and the nursing notes.  Pertinent labs & imaging results that were available during my care of the patient were reviewed by me and considered in my medical decision making (see chart for details).  Clinical Course as of Nov 28 204  Mon Nov 28, 2017  0006 Temp(!): 100.8 F (38.2 C) [CG]  0056 WBC(!): 15.1 [CG]    Clinical Course User Index [CG] Kinnie Feil, PA-C   0045: SIRS criteria met with fever, WBC however pt is non toxic appearing, so will hold off on sepsis code given clinical exam.  WBC only slightly higher than earlier today at previous ER visit.  Lactic is normal.  I reviewed CT pelvis done yesterday that showed 1.2 x 5.6 x 1.5 cm fluid collection, given exam this is likely abscess.  Spoke to Dr Hassell Done who will come to ER to evaluate patient, may attempt I&D in ER.  Abx, analgesia, IVF ordered. Pt updated.   0200: I&D performed in ER by Dr. Hassell Done who recommends switching abx to augmentin. Pt considered appropriate for discharge.  Pain significantly improved after procedure. Discussed plan with pt and family who are in agreement.   Final Clinical Impressions(s) / ED Diagnoses   Final diagnoses:  Perirectal abscess    ED Discharge Orders         Ordered    amoxicillin-clavulanate (AUGMENTIN) 875-125 MG tablet  Every 12 hours     11/28/17 0134           Kinnie Feil, PA-C 11/28/17 0206    Veryl Speak, MD 11/28/17 [phone]

## 2017-11-28 NOTE — ED Notes (Signed)
ED Provider at bedside. 

## 2017-11-28 NOTE — Consult Note (Signed)
Chief Complaint:  Perianal pain-seen in [facility] earlier today with CT scan  History of Present Illness:  Neil Padilla is an 57 y.o. male who was seen again tonight in the [facility] after he was brought her by ambulance for rectal pain and fever.  Correctly diagnosed by PA with perirectal abscess on the left side.    Past Medical History:  Diagnosis Date  . Kidney stone     History reviewed. No pertinent surgical history.  Current Facility-Administered Medications  Medication Dose Route Frequency Provider Last Rate Last Dose  . piperacillin-tazobactam (ZOSYN) IVPB 3.375 g  3.375 g Intravenous Once Veryl Speak, MD       Current Outpatient Medications  Medication Sig Dispense Refill  . acetaminophen (TYLENOL) 500 MG tablet Take 1,000 mg by mouth every 6 (six) hours as needed for mild pain.    Marland Kitchen HYDROcodone-acetaminophen (NORCO/VICODIN) 5-325 MG tablet Take 1 tablet by mouth every 6 (six) hours as needed for moderate pain. 15 tablet 0  . ibuprofen (ADVIL,MOTRIN) 200 MG tablet Take 400 mg by mouth every 6 (six) hours as needed for mild pain.    Marland Kitchen ondansetron (ZOFRAN) 4 MG tablet Take 1 tablet (4 mg total) by mouth every 6 (six) hours. (Patient not taking: Reported on 11/27/2017) 12 tablet 0  . senna-docusate (SENOKOT-S) 8.6-50 MG per tablet Take 1 tablet by mouth daily. (Patient not taking: Reported on 11/27/2017) 30 tablet 0  . sulfamethoxazole-trimethoprim (BACTRIM DS,SEPTRA DS) 800-160 MG tablet Take 2 tablets by mouth 2 (two) times daily for 7 days. 40 tablet 0  . Tamsulosin HCl (FLOMAX) 0.4 MG CAPS Take 1 capsule (0.4 mg total) by mouth daily. (Patient not taking: Reported on 11/27/2017) 15 capsule 0   Patient has no known allergies. No family history on file. Social History:   reports that he has never smoked. He does not have any smokeless tobacco history on file. He reports that he does not drink alcohol or use drugs.   REVIEW OF SYSTEMS : Negative except for noncontributory  Physical Exam:    Blood pressure 117/80, pulse 75, temperature 99.4 F (37.4 C), temperature source Oral, resp. rate 14, height _0  (1.753 m), weight 70.8 kg, SpO2 94 %. Body mass index is 23.04 kg/m.  Gen:  WDWN WM NAD  GU:  Fluctuant area in the left perirectal region that is quite tender  LABORATORY RESULTS: Results for orders placed or performed during the hospital encounter of 11/27/17 (from the past 48 hour(s))  Comprehensive metabolic panel     Status: Abnormal   Collection Time: 11/27/17 11:18 PM  Result Value Ref Range   Sodium 139 135 - 145 mmol/L   Potassium 3.3 (L) 3.5 - 5.1 mmol/L   Chloride 107 98 - 111 mmol/L   CO2 22 22 - 32 mmol/L   Glucose, Bld 102 (H) 70 - 99 mg/dL   BUN 9 6 - 20 mg/dL   Creatinine, Ser 0.72 0.61 - 1.24 mg/dL   Calcium 8.9 8.9 - 10.3 mg/dL   Total Protein 6.8 6.5 - 8.1 g/dL   Albumin 4.2 3.5 - 5.0 g/dL   AST 19 15 - 41 U/L   ALT 23 0 - 44 U/L   Alkaline Phosphatase 57 38 - 126 U/L   Total Bilirubin 1.1 0.3 - 1.2 mg/dL   GFR calc non Af Amer >60 >60 mL/min   GFR calc Af Amer >60 >60 mL/min    Comment: (NOTE) The eGFR has been calculated using the CKD  EPI equation. This calculation has not been validated in all clinical situations. eGFR's persistently <60 mL/min signify possible Chronic Kidney Disease.    Anion gap 10 5 - 15    Comment: Performed at [facility], [place] [address], [place], [place] [address]  CBC with Differential     Status: Abnormal   Collection Time: 11/27/17 11:18 PM  Result Value Ref Range   WBC 15.1 (H) 4.0 - 10.5 K/uL   RBC 4.80 4.22 - 5.81 MIL/uL   Hemoglobin 14.4 13.0 - 17.0 g/dL   HCT 41.7 39.0 - 52.0 %   MCV 86.9 78.0 - 100.0 fL   MCH 30.0 26.0 - 34.0 pg   MCHC 34.5 30.0 - 36.0 g/dL   RDW 13.5 11.5 - 15.5 %   Platelets 341 150 - 400 K/uL   Neutrophils Relative % 71 %   Neutro Abs 10.6 (H) 1.7 - 7.7 K/uL   Lymphocytes Relative 16 %   Lymphs Abs 2.4 0.7 - 4.0 K/uL   Monocytes Relative 10 %   Monocytes  Absolute 1.6 (H) 0.1 - 1.0 K/uL   Eosinophils Relative 3 %   Eosinophils Absolute 0.5 0.0 - 0.7 K/uL   Basophils Relative 0 %   Basophils Absolute 0.0 0.0 - 0.1 K/uL    Comment: Performed at [facility], [place] [address], [place], [place] [address]  Protime-INR     Status: None   Collection Time: 11/27/17 11:18 PM  Result Value Ref Range   Prothrombin Time 14.1 11.4 - 15.2 seconds   INR 1.10     Comment: Performed at [facility], [place] [address], [place], [place] [address]  Urinalysis, Routine w reflex microscopic     Status: Abnormal   Collection Time: 11/27/17 11:23 PM  Result Value Ref Range   Color, Urine STRAW (A) YELLOW   APPearance CLEAR CLEAR   Specific Gravity, Urine 1.006 1.005 - 1.030   pH 8.0 5.0 - 8.0   Glucose, UA NEGATIVE NEGATIVE mg/dL   Hgb urine dipstick NEGATIVE NEGATIVE   Bilirubin Urine NEGATIVE NEGATIVE   Ketones, ur NEGATIVE NEGATIVE mg/dL   Protein, ur NEGATIVE NEGATIVE mg/dL   Nitrite NEGATIVE NEGATIVE   Leukocytes, UA NEGATIVE NEGATIVE    Comment: Performed at [facility] [address], [place], [place] [address]  I-Stat CG4 Lactic Acid, ED     Status: None   Collection Time: 11/27/17 11:24 PM  Result Value Ref Range   Lactic Acid, Venous 1.25 0.5 - 1.9 mmol/L     RADIOLOGY RESULTS: Dg Chest 2 View  Result Date: 11/28/2017 CLINICAL DATA:  57 year old male with fever. EXAM: CHEST - 2 VIEW COMPARISON:  None. FINDINGS: There is no focal consolidation, pleural effusion, or pneumothorax. The cardiac silhouette is within normal limits. Mild midthoracic compression deformity, age indeterminate, possibly chronic. Clinical correlation is recommended. IMPRESSION: No active cardiopulmonary disease. Electronically Signed   By: Anner Crete M.D.   On: 11/28/2017 00:09   Ct Pelvis W Contrast  Result Date: 11/27/2017 CLINICAL DATA:  Pt states hx of hemorrhoids that required surgery. States for 3 days he  has had a swollen hemorrhoid, causing discomfort with movement. Denies any rectal bleeding. EXAM: CT PELVIS WITH CONTRAST TECHNIQUE: Multidetector CT imaging of the pelvis was performed using the standard protocol following the bolus administration of intravenous contrast. CONTRAST:  <See Chart> ISOVUE-300 IOPAMIDOL (ISOVUE-300) INJECTION 61% COMPARISON:  12/22/2011 FINDINGS: Urinary Tract: Visualized portion of the kidneys is  unremarkable. Ureters are normal in appearance. The urinary bladder and visualized urethra are unremarkable in appearance. Bowel: There is moderate stool burden within nondilated loops of colon. No significant colonic inflammation. RIGHT inguinal hernia contains nondilated small bowel. LEFT inguinal hernia contains nondilated small bowel loops. No evidence for obstruction related to hernia. Vascular/Lymphatic: No pathologically enlarged lymph nodes. No significant vascular abnormality seen. Reproductive:  No mass or other significant abnormality Other: Along the LEFT LATERAL wall of the rectum, there is an oval low-attenuation fluid attenuation mass which measures 1.2 x 5.6 x 1.5 centimeters. There is associated peripheral enhancement. Musculoskeletal: No suspicious bone lesions identified. IMPRESSION: Oval enhancing fluid collection in the LEFT LATERAL wall of the rectum. Considerations include thrombosed hemorrhoid or abscess. 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 Electronically Signed   By: Nolon Nations M.D.   On: 11/27/2017 13:48    Problem List: Patient Active Problem List   Diagnosis Date Noted  . COLONIC POLYPS, HX OF 12/26/2006    Assessment & Plan: Perirectal abscess.  With the assistance of the ER nurse, I infiltrated the region with lidocaine and with a radial incision I drained a significant amount of yellow non foul smelling pus.  The patient experienced immediate relief.    Ok to go home and follow up in our office.  Sitz baths  now and after BMs.  No packing.      Matt B. Hassell Done, MD, [facility] [phone] beeper [phone]  11/28/2017 1:21 AM

## 2017-12-03 LAB — CULTURE, BLOOD (ROUTINE X 2)
CULTURE: NO GROWTH
Culture: NO GROWTH
SPECIAL REQUESTS: ADEQUATE
SPECIAL REQUESTS: ADEQUATE

## 2019-11-19 ENCOUNTER — Other Ambulatory Visit: Payer: Self-pay

## 2019-11-19 ENCOUNTER — Telehealth (HOSPITAL_COMMUNITY): Payer: Self-pay

## 2019-11-19 ENCOUNTER — Encounter (HOSPITAL_COMMUNITY): Payer: Self-pay

## 2019-11-19 ENCOUNTER — Ambulatory Visit (INDEPENDENT_AMBULATORY_CARE_PROVIDER_SITE_OTHER): Payer: 59

## 2019-11-19 ENCOUNTER — Ambulatory Visit (HOSPITAL_COMMUNITY)
Admission: EM | Admit: 2019-11-19 | Discharge: 2019-11-19 | Disposition: A | Discharge status: Home / Self Care | Payer: 59 | Attending: Family Medicine | Admitting: Family Medicine

## 2019-11-19 DIAGNOSIS — R509 Fever, unspecified: Secondary | ICD-10-CM

## 2019-11-19 DIAGNOSIS — U071 COVID-19: Secondary | ICD-10-CM | POA: Insufficient documentation

## 2019-11-19 DIAGNOSIS — R05 Cough: Secondary | ICD-10-CM

## 2019-11-19 DIAGNOSIS — Z20822 Contact with and (suspected) exposure to covid-19: Secondary | ICD-10-CM | POA: Diagnosis not present

## 2019-11-19 DIAGNOSIS — J069 Acute upper respiratory infection, unspecified: Secondary | ICD-10-CM

## 2019-11-19 MED ORDER — PREDNISONE 20 MG PO TABS: 40.0000 mg | ORAL_TABLET | Freq: Every day | ORAL | 0 refills | Status: DC

## 2019-11-19 MED ORDER — PROMETHAZINE-DM 6.25-15 MG/5ML PO SYRP: 5.0000 mL | ORAL_SOLUTION | Freq: Four times a day (QID) | ORAL | 0 refills | Status: DC | PRN

## 2019-11-19 NOTE — ED Notes (Signed)
Pt presents for specimen collection. Reports his covid test from [facility] will not be back for another couple days and has changed his mind about being tested here today. Pt was just seen by Jerrilyn Cairo.

## 2019-11-19 NOTE — ED Triage Notes (Signed)
Pt presents with non productive cough, sore throat and back pain, unsure of when it started.

## 2019-11-19 NOTE — ED Provider Notes (Signed)
[facility]    CSN: [id number] Arrival date & time: 11/19/19  1303      History   Chief Complaint Chief Complaint  Patient presents with  . Back Pain  . Cough  . Sore Throat    HPI Neil Padilla is a 59 y.o. male.   HPI   Encounter for COVID-19 Testing in Symptomatic Patient Recent Exposure to persons positive for COVID-19: Yes , wife and son in home are both COVID-19 positive  Experienced Fever: unknown, however, endorses night sweats. Febrile on arrival today  Current Symptoms: Body aches, cough, chest tightness, right mid back pain, sore throat.  Symptom management include: nothing  Last COVID-19 Test: Has a COVID testing pending at [facility], uncertain of when results will be available   Denies distress with breathing. He maintains his since of smell and taste.  Past Medical History:  Diagnosis Date  . Kidney stone     Patient Active Problem List   Diagnosis Date Noted  . COLONIC POLYPS, HX OF 12/26/2006    History reviewed. No pertinent surgical history.     Home Medications    Prior to Admission medications   Medication Sig Start Date End Date Taking? Authorizing Provider  acetaminophen (TYLENOL) 500 MG tablet Take 1,000 mg by mouth every 6 (six) hours as needed for mild pain.    [provider]  amoxicillin-clavulanate (AUGMENTIN) 875-125 MG tablet Take 1 tablet by mouth every 12 (twelve) hours. 11/28/17   Liberty Handy, PA-C  HYDROcodone-acetaminophen (NORCO/VICODIN) 5-325 MG tablet Take 1 tablet by mouth every 6 (six) hours as needed for moderate pain. 11/27/17   Lawyer, Cristal Deer, PA-C  ibuprofen (ADVIL,MOTRIN) 200 MG tablet Take 400 mg by mouth every 6 (six) hours as needed for mild pain.    [provider]  ondansetron (ZOFRAN) 4 MG tablet Take 1 tablet (4 mg total) by mouth every 6 (six) hours. Patient not taking: Reported on 11/27/2017 12/22/11   Mendel Ryder, MD  senna-docusate (SENOKOT-S) 8.6-50 MG per tablet Take  1 tablet by mouth daily. Patient not taking: Reported on 11/27/2017 12/22/11   Mendel Ryder, MD  Tamsulosin HCl (FLOMAX) 0.4 MG CAPS Take 1 capsule (0.4 mg total) by mouth daily. Patient not taking: Reported on 11/27/2017 12/22/11   Mendel Ryder, MD    Family History Family History  Family history unknown: Yes    Social History Social History   Tobacco Use  . Smoking status: Never Smoker  Substance Use Topics  . Alcohol use: No  . Drug use: No     Allergies   Patient has no known allergies. Review of Systems Review of Systems Pertinent negatives listed in HPI  Physical Exam Triage Vital Signs ED Triage Vitals  Enc Vitals Group     BP 11/19/19 1413 123/80     Pulse Rate 11/19/19 1413 86     Resp 11/19/19 1413 17     Temp 11/19/19 1413 100 F (37.8 C)     Temp Source 11/19/19 1413 Oral     SpO2 11/19/19 1413 97 %     Weight --      Height --      Head Circumference --      Peak Flow --      Pain Score 11/19/19 1411 6     Pain Loc --      Pain Edu? --      Excl. in GC? --    No data found.  Updated Vital Signs  BP 123/80 (BP Location: Right Arm)   Pulse 86   Temp 100 F (37.8 C) (Oral)   Resp 17   SpO2 97%   Visual Acuity Right Eye Distance:   Left Eye Distance:   Bilateral Distance:    Right Eye Near:   Left Eye Near:    Bilateral Near:     Physical Exam General appearance: alert, well developed, well nourished, cooperative and in no distress Head: Normocephalic, without obvious abnormality, atraumatic Respiratory: Coarse lung sounds, expiratory wheeze, rhonchi present, no crackles or rales Heart: rate and rhythm normal. No gallop or murmurs noted on exam  Skin: Skin color, texture, turgor normal. No rashes seen  Psych: Appropriate mood and affect. Neurologic: Mental status: Alert, oriented to person, place, and time, thought content appropriate.   UC Treatments / Results  Labs (all labs ordered are listed, but only abnormal results are  displayed) Labs Reviewed - No data to display  EKG   Radiology No results found.  Procedures Procedures (including critical care time)  Medications Ordered in UC Medications - No data to display  Initial Impression / Assessment and Plan / UC Course  I have reviewed the triage vital signs and the nursing notes.  Pertinent labs & imaging results that were available during my care of the patient were reviewed by me and considered in my medical decision making (see chart for details).     COVID-19 test pending. Given close exposure and symptoms present on exam today, patient advised to start quarantine as he is very likely positive.  See medication discharge orders for symptomatic management of current symptoms. Patient advised to see medical assistance at the ER if develops problems breathing or chest pain. An After Visit Summary was printed and given to the patient. Final Clinical Impressions(s) / UC Diagnoses   Final diagnoses:  Suspected COVID-19 virus infection  Viral URI with cough     Discharge Instructions     Your chest x-ray was normal. Continue with current medications. For fever take ibuprofen and tylenol. Continue Vitamin D 5,000 IU daily, Vitamin C 500 mg twice daily, Zinc 50 mg daily I have sent medication to pharmacy for your symptoms. Your COVID 19 results will be available in 24 hours. Negative results are immediately resulted to Mychart. You may call tomorrow for your results     ED Prescriptions    Medication Sig Dispense Auth. Provider   predniSONE (DELTASONE) 20 MG tablet Take 2 tablets (40 mg total) by mouth daily with breakfast for 5 days. 10 tablet Bing Neighbors, FNP   promethazine-dextromethorphan (PROMETHAZINE-DM) 6.25-15 MG/5ML syrup Take 5 mLs by mouth 4 (four) times daily as needed for cough. 120 mL Bing Neighbors, FNP     PDMP not reviewed this encounter.   Bing Neighbors, FNP 11/21/19 [phone]

## 2019-11-19 NOTE — Discharge Instructions (Addendum)
Your chest x-ray was normal. Continue with current medications. For fever take ibuprofen and tylenol. Continue Vitamin D 5,000 IU daily, Vitamin C 500 mg twice daily, Zinc 50 mg daily I have sent medication to pharmacy for your symptoms. Your COVID 19 results will be available in 24 hours. Negative results are immediately resulted to Mychart. You may call tomorrow for your results

## 2019-11-19 NOTE — ED Notes (Signed)
Pt states was tested for COVID at [facility] on Friday, but hasn't gotten the results yet.

## 2019-11-20 LAB — SARS CORONAVIRUS 2 (TAT 6-24 HRS): SARS Coronavirus 2: POSITIVE — AB

## 2019-11-23 ENCOUNTER — Emergency Department (HOSPITAL_COMMUNITY): Payer: 59

## 2019-11-23 ENCOUNTER — Emergency Department (HOSPITAL_COMMUNITY)
Admission: EM | Admit: 2019-11-23 | Discharge: 2019-11-24 | Disposition: A | Discharge status: Home / Self Care | Payer: 59 | Attending: Emergency Medicine | Admitting: Emergency Medicine

## 2019-11-23 ENCOUNTER — Encounter (HOSPITAL_COMMUNITY): Payer: Self-pay

## 2019-11-23 ENCOUNTER — Other Ambulatory Visit: Payer: Self-pay

## 2019-11-23 DIAGNOSIS — E876 Hypokalemia: Secondary | ICD-10-CM | POA: Diagnosis not present

## 2019-11-23 DIAGNOSIS — R519 Headache, unspecified: Secondary | ICD-10-CM

## 2019-11-23 DIAGNOSIS — R110 Nausea: Secondary | ICD-10-CM

## 2019-11-23 DIAGNOSIS — U071 COVID-19: Secondary | ICD-10-CM | POA: Diagnosis not present

## 2019-11-23 DIAGNOSIS — E871 Hypo-osmolality and hyponatremia: Secondary | ICD-10-CM | POA: Insufficient documentation

## 2019-11-23 DIAGNOSIS — R509 Fever, unspecified: Secondary | ICD-10-CM | POA: Diagnosis present

## 2019-11-23 DIAGNOSIS — R059 Cough, unspecified: Secondary | ICD-10-CM

## 2019-11-23 DIAGNOSIS — R197 Diarrhea, unspecified: Secondary | ICD-10-CM

## 2019-11-23 NOTE — ED Triage Notes (Signed)
Pt reports he was dx with covid 8/23. Pt c/o ongoing back pain, CP, sore throat and fever. Taking OTC tylenol and Ibuprofen

## 2019-11-24 LAB — BASIC METABOLIC PANEL
Anion gap: 11 (ref 5–15)
BUN: 12 mg/dL (ref 6–20)
CO2: 21 mmol/L — ABNORMAL LOW (ref 22–32)
Calcium: 8.3 mg/dL — ABNORMAL LOW (ref 8.9–10.3)
Chloride: 100 mmol/L (ref 98–111)
Creatinine, Ser: 0.77 mg/dL (ref 0.61–1.24)
GFR calc Af Amer: >60 mL/min (ref >60)
GFR calc non Af Amer: >60 mL/min (ref >60)
Glucose, Bld: 124 mg/dL — ABNORMAL HIGH (ref 70–99)
Potassium: 3.4 mmol/L — ABNORMAL LOW (ref 3.5–5.1)
Sodium: 132 mmol/L — ABNORMAL LOW (ref 135–145)

## 2019-11-24 LAB — CBC WITH DIFFERENTIAL/PLATELET
Abs Immature Granulocytes: 0.06 10*3/uL (ref 0.00–0.07)
Basophils Absolute: 0 10*3/uL (ref 0.0–0.1)
Basophils Relative: 0 %
Eosinophils Absolute: 0 10*3/uL (ref 0.0–0.5)
Eosinophils Relative: 0 %
HCT: 43.3 % (ref 39.0–52.0)
Hemoglobin: 14.5 g/dL (ref 13.0–17.0)
Immature Granulocytes: 2 %
Lymphocytes Relative: 15 %
Lymphs Abs: 0.6 10*3/uL — ABNORMAL LOW (ref 0.7–4.0)
MCH: 29.3 pg (ref 26.0–34.0)
MCHC: 33.5 g/dL (ref 30.0–36.0)
MCV: 87.5 fL (ref 80.0–100.0)
Monocytes Absolute: 0.5 10*3/uL (ref 0.1–1.0)
Monocytes Relative: 13 %
Neutro Abs: 2.9 10*3/uL (ref 1.7–7.7)
Neutrophils Relative %: 70 %
Platelets: 207 10*3/uL (ref 150–400)
RBC: 4.95 MIL/uL (ref 4.22–5.81)
RDW: 13.7 % (ref 11.5–15.5)
WBC: 4.1 10*3/uL (ref 4.0–10.5)
nRBC: 0 % (ref 0.0–0.2)

## 2019-11-24 MED ORDER — PREDNISONE 20 MG PO TABS
60.0000 mg | ORAL_TABLET | Freq: Once | ORAL | Status: AC
  Administered 2019-11-24: 60 mg via ORAL
  Filled 2019-11-24: qty 3

## 2019-11-24 MED ORDER — LOPERAMIDE HCL 2 MG PO CAPS
4.0000 mg | ORAL_CAPSULE | Freq: Once | ORAL | Status: AC
  Administered 2019-11-24: 4 mg via ORAL
  Filled 2019-11-24: qty 2

## 2019-11-24 MED ORDER — PROCHLORPERAZINE EDISYLATE 10 MG/2ML IJ SOLN
10.0000 mg | Freq: Once | INTRAMUSCULAR | Status: AC
  Administered 2019-11-24: 10 mg via INTRAVENOUS
  Filled 2019-11-24: qty 2

## 2019-11-24 MED ORDER — POTASSIUM CHLORIDE CRYS ER 20 MEQ PO TBCR
40.0000 meq | EXTENDED_RELEASE_TABLET | Freq: Once | ORAL | Status: AC
  Administered 2019-11-24: 40 meq via ORAL
  Filled 2019-11-24: qty 2

## 2019-11-24 MED ORDER — ACETAMINOPHEN 325 MG PO TABS
650.0000 mg | ORAL_TABLET | Freq: Once | ORAL | Status: AC
  Administered 2019-11-24: 650 mg via ORAL
  Filled 2019-11-24: qty 2

## 2019-11-24 MED ORDER — PROCHLORPERAZINE MALEATE 10 MG PO TABS: 10.0000 mg | ORAL_TABLET | Freq: Four times a day (QID) | ORAL | 0 refills | Status: AC | PRN

## 2019-11-24 MED ORDER — ALBUTEROL SULFATE HFA 108 (90 BASE) MCG/ACT IN AERS
2.0000 | INHALATION_SPRAY | Freq: Once | RESPIRATORY_TRACT | Status: AC
  Administered 2019-11-24: 2 via RESPIRATORY_TRACT
  Filled 2019-11-24: qty 6.7

## 2019-11-24 MED ORDER — LACTATED RINGERS IV BOLUS
1000.0000 mL | Freq: Once | INTRAVENOUS | Status: AC
  Administered 2019-11-24: 1000 mL via INTRAVENOUS

## 2019-11-24 MED ORDER — PREDNISONE 50 MG PO TABS: 50.0000 mg | ORAL_TABLET | Freq: Every day | ORAL | 0 refills | Status: DC

## 2019-11-24 NOTE — ED Provider Notes (Signed)
[facility] EMERGENCY DEPARTMENT Provider Note   CSN: [id number] Arrival date & time: 11/23/19  1346   History Chief Complaint  Patient presents with   Covid Exposure    Neil Padilla is a 59 y.o. male.  The history is provided by the patient.  He has no significant past history and comes in because of generally feeling poorly.  He has been sick for about a week and was diagnosed with COVID-19 5 days ago.  He has had subjective fevers as well as chills and sweats.  He has a nonproductive cough.  Has lost his sense of smell and taste.  There has been nausea but no vomiting.  He has had diarrhea and generalized body aches.  His wife had COVID-19 before he came down with it.  He has not been vaccinated against COVID-19.  His main complaint today is he is feeling generally weak.  Past Medical History:  Diagnosis Date   Kidney stone     Patient Active Problem List   Diagnosis Date Noted   COLONIC POLYPS, HX OF 12/26/2006    No past surgical history on file.     Family History  Family history unknown: Yes    Social History   Tobacco Use   Smoking status: Never Smoker  Substance Use Topics   Alcohol use: No   Drug use: No    Home Medications Prior to Admission medications   Medication Sig Start Date End Date Taking? Authorizing Provider  acetaminophen (TYLENOL) 500 MG tablet Take 1,000 mg by mouth every 6 (six) hours as needed for mild pain.    [provider]  amoxicillin-clavulanate (AUGMENTIN) 875-125 MG tablet Take 1 tablet by mouth every 12 (twelve) hours. 11/28/17   Liberty Handy, PA-C  HYDROcodone-acetaminophen (NORCO/VICODIN) 5-325 MG tablet Take 1 tablet by mouth every 6 (six) hours as needed for moderate pain. 11/27/17   Lawyer, Cristal Deer, PA-C  ibuprofen (ADVIL,MOTRIN) 200 MG tablet Take 400 mg by mouth every 6 (six) hours as needed for mild pain.    [provider]  ondansetron (ZOFRAN) 4 MG tablet Take 1 tablet (4 mg  total) by mouth every 6 (six) hours. Patient not taking: Reported on 11/27/2017 12/22/11   Mendel Ryder, MD  predniSONE (DELTASONE) 20 MG tablet Take 2 tablets (40 mg total) by mouth daily with breakfast for 5 days. 11/19/19 11/24/19  Bing Neighbors, FNP  promethazine-dextromethorphan (PROMETHAZINE-DM) 6.25-15 MG/5ML syrup Take 5 mLs by mouth 4 (four) times daily as needed for cough. 11/19/19   Bing Neighbors, FNP  senna-docusate (SENOKOT-S) 8.6-50 MG per tablet Take 1 tablet by mouth daily. Patient not taking: Reported on 11/27/2017 12/22/11   Mendel Ryder, MD  Tamsulosin HCl (FLOMAX) 0.4 MG CAPS Take 1 capsule (0.4 mg total) by mouth daily. Patient not taking: Reported on 11/27/2017 12/22/11   Mendel Ryder, MD    Allergies    Patient has no known allergies.  Review of Systems   Review of Systems  All other systems reviewed and are negative.   Physical Exam Updated Vital Signs BP 111/81 (BP Location: Left Arm)    Pulse 94    Temp (!) 100.5 F (38.1 C) (Oral)    Resp 19    SpO2 96%   Physical Exam Vitals and nursing note reviewed.   59 year old male, resting comfortably and in no acute distress. Vital signs are significant for a low-grade fever. Oxygen saturation is 96%, which is normal. Head is normocephalic  and atraumatic. PERRLA, EOMI. Oropharynx is clear. Neck is nontender and supple without adenopathy or JVD. Back is nontender and there is no CVA tenderness. Lungs are clear without rales, wheezes, or rhonchi. Chest is nontender. Heart has regular rate and rhythm without murmur. Abdomen is soft, flat, nontender without masses or hepatosplenomegaly and peristalsis is hypoactive. Extremities have no cyanosis or edema, full range of motion is present. Skin is warm and dry without rash. Neurologic: Mental status is normal, cranial nerves are intact, there are no motor or sensory deficits.  ED Results / Procedures / Treatments   Labs (all labs ordered are listed, but  only abnormal results are displayed) Labs Reviewed  BASIC METABOLIC PANEL - Abnormal; Notable for the following components:      Result Value   Sodium 132 (*)    Potassium 3.4 (*)    CO2 21 (*)    Glucose, Bld 124 (*)    Calcium 8.3 (*)    All other components within normal limits  CBC WITH DIFFERENTIAL/PLATELET - Abnormal; Notable for the following components:   Lymphs Abs 0.6 (*)    All other components within normal limits    EKG EKG Interpretation  Date/Time:  Friday November 23 2019 13:57:42 EDT Ventricular Rate:  100 PR Interval:  154 QRS Duration: 94 QT Interval:  342 QTC Calculation: 441 R Axis:   42 Text Interpretation: Normal sinus rhythm Normal ECG No old tracing to compare Confirmed by Dione Booze ([id number]) on 11/23/2019 11:07:57 PM   Radiology DG CHEST PORT 1 VIEW  Result Date: 11/23/2019 CLINICAL DATA:  Productive cough and chest pain, COVID-19 positivity EXAM: PORTABLE CHEST 1 VIEW COMPARISON:  11/19/2019 FINDINGS: Cardiac shadow is within normal limits. The lungs are well aerated bilaterally. No focal infiltrate or sizable effusion is seen. No bony abnormality is noted. IMPRESSION: No active disease. Electronically Signed   By: Alcide Clever M.D.   On: 11/23/2019 19:37    Procedures Procedures   Medications Ordered in ED Medications  predniSONE (DELTASONE) tablet 60 mg (has no administration in time range)  potassium chloride SA (KLOR-CON) CR tablet 40 mEq (has no administration in time range)  lactated ringers bolus 1,000 mL (1,000 mLs Intravenous New Bag/Given 11/24/19 0236)  prochlorperazine (COMPAZINE) injection 10 mg (10 mg Intravenous Given 11/24/19 0236)  loperamide (IMODIUM) capsule 4 mg (4 mg Oral Given 11/24/19 0239)  acetaminophen (TYLENOL) tablet 650 mg (650 mg Oral Given 11/24/19 0239)  albuterol (VENTOLIN HFA) 108 (90 Base) MCG/ACT inhaler 2 puff (2 puffs Inhalation Given 11/24/19 0240)    ED Course  I have reviewed the triage vital signs and the  nursing notes.  Pertinent labs & imaging results that were available during my care of the patient were reviewed by me and considered in my medical decision making (see chart for details).  MDM Rules/Calculators/A&P COVID-19 infection with malaise, nausea, diarrhea.  Oxygen saturations are adequate.  Chest x-ray shows no evidence of pneumonia.  ECG is normal.  Old records are reviewed confirming recent diagnosis of COVID-19.  We will give IV fluids, prochlorperazine and give oral acetaminophen and loperamide.  He will be given 2 puffs from an albuterol inhaler and reassessed.  Labs show mild hyponatremia and mild hypokalemia.  He is given a dose of oral potassium.  Headache and nausea are much improved after above-noted treatment.  Cough is improved as well.  He is advised to continue using his inhaler as needed, given prescription for prochlorperazine and also started on prednisone  for 5days.  Follow-up with PCP.  Advised to make sure he maintains adequate hydration.  Return precautions discussed.  Nehal Shives was evaluated in Emergency Department on 11/24/2019 for the symptoms described in the history of present illness. He was evaluated in the context of the global COVID-19 pandemic, which necessitated consideration that the patient might be at risk for infection with the SARS-CoV-2 virus that causes COVID-19. Institutional protocols and algorithms that pertain to the evaluation of patients at risk for COVID-19 are in a state of rapid change based on information released by regulatory bodies including the CDC and federal and state organizations. These policies and algorithms were followed during the patient's care in the ED. Final Clinical Impression(s) / ED Diagnoses Final diagnoses:  Cough  COVID-19 virus infection  Bad headache  Nausea  Diarrhea, unspecified type  Hypokalemia  Hyponatremia    Rx / DC Orders ED Discharge Orders         Ordered    prochlorperazine (COMPAZINE) 10 MG tablet   Every 6 hours PRN        11/24/19 0401    predniSONE (DELTASONE) 50 MG tablet  Daily        11/24/19 0401           Dione Booze, MD 11/24/19 [phone]

## 2019-11-24 NOTE — Discharge Instructions (Signed)
Drink plenty of fluids - it is very important that you keep yourself hydrated.  Take acetaminophen and ibuprofen as needed for fever or pain.  Use the inhaler - two puffs, every four hours - as needed for coughing or shortness of breath.  Take loperamide up to four times a day as needed for diarrhea.  Return if you are having difficulty breathing.

## 2020-10-14 ENCOUNTER — Encounter: Payer: Self-pay | Admitting: Orthopedic Surgery

## 2020-10-14 ENCOUNTER — Ambulatory Visit (INDEPENDENT_AMBULATORY_CARE_PROVIDER_SITE_OTHER): Payer: 59

## 2020-10-14 ENCOUNTER — Ambulatory Visit (INDEPENDENT_AMBULATORY_CARE_PROVIDER_SITE_OTHER): Payer: 59 | Admitting: Orthopedic Surgery

## 2020-10-14 VITALS — Ht 69.0 in | Wt 156.0 lb

## 2020-10-14 DIAGNOSIS — G8929 Other chronic pain: Secondary | ICD-10-CM

## 2020-10-14 DIAGNOSIS — M25511 Pain in right shoulder: Secondary | ICD-10-CM | POA: Diagnosis not present

## 2020-10-27 ENCOUNTER — Encounter: Payer: Self-pay | Admitting: Orthopedic Surgery

## 2020-10-27 DIAGNOSIS — M25511 Pain in right shoulder: Secondary | ICD-10-CM | POA: Diagnosis not present

## 2020-10-27 DIAGNOSIS — G8929 Other chronic pain: Secondary | ICD-10-CM | POA: Diagnosis not present

## 2020-10-27 MED ORDER — LIDOCAINE HCL 1 % IJ SOLN
5.0000 mL | INTRAMUSCULAR | Status: AC | PRN
  Administered 2020-10-27: 5 mL

## 2020-10-27 MED ORDER — METHYLPREDNISOLONE ACETATE 40 MG/ML IJ SUSP
40.0000 mg | INTRAMUSCULAR | Status: AC | PRN
  Administered 2020-10-27: 40 mg via INTRA_ARTICULAR

## 2020-10-27 NOTE — Progress Notes (Signed)
Office Visit Note   Patient: Neil Padilla           Date of Birth: 23-Aug-1960           MRN: [id number] Visit Date: 10/14/2020              Requested by: Kathyrn Sheriff, MD [address] IR[address][id number] [facility] [address] [place],  [place] [address] PCP: Kathyrn Sheriff, MD  Chief Complaint  Patient presents with   Right Shoulder - Pain      HPI: Patient is a 60 year old gentleman who presents with a 3-week history of right shoulder pain he states he was pulling on a large piece of wood and felt something in his shoulder.  Assessment & Plan: Visit Diagnoses:  1. Chronic right shoulder pain     Plan: Patient was given instructions for internal and external rotation strengthening and scapular stabilization.  Follow-Up Instructions: No follow-ups on file.   Ortho Exam  Patient is alert, oriented, no adenopathy, well-dressed, normal affect, normal respiratory effort. Examination patient has full range of motion of the right shoulder there is no tenderness to palpation over the biceps.  He does have pain with Neer and Hawkins impingement test there is no ecchymosis.  Imaging: No results found. No images are attached to the encounter.  Labs: Lab Results  Component Value Date   REPTSTATUS 12/03/2017 FINAL 11/27/2017   CULT  11/27/2017    NO GROWTH 5 DAYS Performed at [facility], [address], [place], [place] [address]      Lab Results  Component Value Date   ALBUMIN 4.2 11/27/2017   ALBUMIN 4.1 12/21/2011    No results found for: MG No results found for: VD25OH  No results found for: PREALBUMIN CBC EXTENDED Latest Ref Rng & Units 11/24/2019 11/27/2017 11/27/2017  WBC 4.0 - 10.5 K/uL 4.1 15.1(H) 13.2(H)  RBC 4.22 - 5.81 MIL/uL 4.95 4.80 4.66  HGB 13.0 - 17.0 g/dL 81.8 29.9 37.1  HCT 69.6 - 52.0 % 43.3 41.7 42.0  PLT 150 - 400 K/uL 207 341 306  NEUTROABS 1.7 - 7.7 K/uL 2.9 10.6(H) 9.0(H)  LYMPHSABS 0.7 - 4.0 K/uL 0.6(L) 2.4 2.2     Body mass index is  23.04 kg/m.  Orders:  Orders Placed This Encounter  Procedures   XR Shoulder Right   No orders of the defined types were placed in this encounter.    Procedures: Large Joint Inj: R subacromial bursa on 10/27/2020 8:51 AM Indications: diagnostic evaluation and pain Details: 22 G 1.5 in needle, posterior approach  Arthrogram: No  Medications: 5 mL lidocaine 1 %; 40 mg methylPREDNISolone acetate 40 MG/ML Outcome: tolerated well, no immediate complications Procedure, treatment alternatives, risks and benefits explained, specific risks discussed. Consent was given by the patient. Immediately prior to procedure a time out was called to verify the correct patient, procedure, equipment, support staff and site/side marked as required. Patient was prepped and draped in the usual sterile fashion.     Clinical Data: No additional findings.  ROS:  All other systems negative, except as noted in the HPI. Review of Systems  Objective: Vital Signs: Ht 5\' 9"  (1.753 m)   Wt 156 lb (70.8 kg)   BMI 23.04 kg/m   Specialty Comments:  No specialty comments available.  PMFS History: Patient Active Problem List   Diagnosis Date Noted   COLONIC POLYPS, HX OF 12/26/2006   Past Medical History:  Diagnosis Date   Kidney stone  Family History  Family history unknown: Yes    History reviewed. No pertinent surgical history. Social History   Occupational History   Not on file  Tobacco Use   Smoking status: Never   Smokeless tobacco: Not on file  Substance and Sexual Activity   Alcohol use: No   Drug use: No   Sexual activity: Yes

## 2020-11-18 ENCOUNTER — Ambulatory Visit (INDEPENDENT_AMBULATORY_CARE_PROVIDER_SITE_OTHER): Payer: 59

## 2020-11-18 ENCOUNTER — Encounter: Payer: Self-pay | Admitting: Orthopedic Surgery

## 2020-11-18 ENCOUNTER — Ambulatory Visit (INDEPENDENT_AMBULATORY_CARE_PROVIDER_SITE_OTHER): Payer: 59 | Admitting: Orthopedic Surgery

## 2020-11-18 DIAGNOSIS — M25461 Effusion, right knee: Secondary | ICD-10-CM | POA: Diagnosis not present

## 2020-11-18 DIAGNOSIS — M25561 Pain in right knee: Secondary | ICD-10-CM | POA: Diagnosis not present

## 2020-11-18 DIAGNOSIS — G8929 Other chronic pain: Secondary | ICD-10-CM

## 2020-11-18 MED ORDER — METHYLPREDNISOLONE ACETATE 40 MG/ML IJ SUSP
40.0000 mg | INTRAMUSCULAR | Status: AC | PRN
  Administered 2020-11-18: 40 mg via INTRA_ARTICULAR

## 2020-11-18 MED ORDER — LIDOCAINE HCL (PF) 1 % IJ SOLN
5.0000 mL | INTRAMUSCULAR | Status: AC | PRN
  Administered 2020-11-18: 5 mL

## 2020-11-18 NOTE — Progress Notes (Signed)
Office Visit Note   Patient: Neil Padilla           Date of Birth: 02/08/1961           MRN: [id number] Visit Date: 11/18/2020              Requested by: Kathyrn Sheriff, MD [address] [id number] [facility] [address] [place],  [place] [address] PCP: Kathyrn Sheriff, MD  Chief Complaint  Patient presents with   Right Knee - Pain      HPI: Patient is a 60 year old gentleman who presents with pain and swelling of the right knee.  Patient states he feels like he has been doing a lot of work in the garage working on his cars with pressure on his knee.  Patient states he cannot bend his knee at this time.  Assessment & Plan: Visit Diagnoses:  1. Chronic pain of right knee   2. Effusion, right knee     Plan: Knee was aspirated and the fluid was clear anticipate this should resolve uneventfully.  Follow-Up Instructions: Return if symptoms worsen or fail to improve.   Ortho Exam  Patient is alert, oriented, no adenopathy, well-dressed, normal affect, normal respiratory effort. Examination patient has a tense effusion of the right knee.  There is no redness no cellulitis collaterals and cruciates are stable.  The knee was aspirated with 90 cc of clear synovial fluid.  Imaging: XR Knee 1-2 Views Right  Result Date: 11/18/2020 2 view radiographs of the right knee shows congruent joint space with no subcondylar cysts or periarticular bony spurs.  No images are attached to the encounter.  Labs: Lab Results  Component Value Date   REPTSTATUS 12/03/2017 FINAL 11/27/2017   CULT  11/27/2017    NO GROWTH 5 DAYS Performed at [facility], [address] [place], [place] [address]      Lab Results  Component Value Date   ALBUMIN 4.2 11/27/2017   ALBUMIN 4.1 12/21/2011    No results found for: MG No results found for: VD25OH  No results found for: PREALBUMIN CBC EXTENDED Latest Ref Rng & Units 11/24/2019 11/27/2017 11/27/2017  WBC 4.0 - 10.5 K/uL 4.1 15.1(H) 13.2(H)  RBC  4.22 - 5.81 MIL/uL 4.95 4.80 4.66  HGB 13.0 - 17.0 g/dL 85.6 31.4 97.0  HCT 26.3 - 52.0 % 43.3 41.7 42.0  PLT 150 - 400 K/uL 207 341 306  NEUTROABS 1.7 - 7.7 K/uL 2.9 10.6(H) 9.0(H)  LYMPHSABS 0.7 - 4.0 K/uL 0.6(L) 2.4 2.2     There is no height or weight on file to calculate BMI.  Orders:  Orders Placed This Encounter  Procedures   XR Knee 1-2 Views Right   No orders of the defined types were placed in this encounter.    Procedures: Large Joint Inj: R knee on 11/18/2020 5:41 PM Indications: pain and diagnostic evaluation Details: 22 G 1.5 in needle, superolateral approach  Arthrogram: No  Medications: 5 mL lidocaine (PF) 1 %; 40 mg methylPREDNISolone acetate 40 MG/ML Aspirate: 90 mL clear Outcome: tolerated well, no immediate complications Procedure, treatment alternatives, risks and benefits explained, specific risks discussed. Consent was given by the patient. Immediately prior to procedure a time out was called to verify the correct patient, procedure, equipment, support staff and site/side marked as required. Patient was prepped and draped in the usual sterile fashion.     Clinical Data: No additional findings.  ROS:  All other systems negative, except as noted in  the HPI. Review of Systems  Objective: Vital Signs: There were no vitals taken for this visit.  Specialty Comments:  No specialty comments available.  PMFS History: Patient Active Problem List   Diagnosis Date Noted   COLONIC POLYPS, HX OF 12/26/2006   Past Medical History:  Diagnosis Date   Kidney stone     Family History  Family history unknown: Yes    History reviewed. No pertinent surgical history. Social History   Occupational History   Not on file  Tobacco Use   Smoking status: Never   Smokeless tobacco: Not on file  Substance and Sexual Activity   Alcohol use: No   Drug use: No   Sexual activity: Yes

## 2020-12-18 ENCOUNTER — Ambulatory Visit (INDEPENDENT_AMBULATORY_CARE_PROVIDER_SITE_OTHER): Payer: 59 | Admitting: Physician Assistant

## 2020-12-18 DIAGNOSIS — G8929 Other chronic pain: Secondary | ICD-10-CM

## 2020-12-18 DIAGNOSIS — M25511 Pain in right shoulder: Secondary | ICD-10-CM | POA: Diagnosis not present

## 2020-12-18 NOTE — Progress Notes (Signed)
Office Visit Note   Patient: Neil Padilla           Date of Birth: 17-Aug-1960           MRN: [id number] Visit Date: 12/18/2020              Requested by: Kathyrn Sheriff, MD [address] QH#[id number] [facility] Med/[facility]el [address] [place],  [place] [address] PCP: Kathyrn Sheriff, MD  Chief Complaint  Patient presents with   Right Shoulder - Pain      HPI: Patient is a pleasant 60 year old gentleman who follows up for his right shoulder pain.  This now has been present for 3 months.  He has failed conservative treatment including home directed exercise program anti-inflammatories and injections.  His last steroid injection did not help at all.  He has limitations especially with internal rotation behind his back.  Also forward elevation.  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  Assessment & Plan: Visit Diagnoses:  1. Chronic right shoulder pain     Plan: Has failed conservative treatment concerns for rotator cuff tear.  We will go forward with an MRI of his right shoulder to follow-up with Dr. Lajoyce Corners afterwards  Follow-Up Instructions: No follow-ups on file.   Ortho Exam  Patient is alert, oriented, no adenopathy, well-dressed, normal affect, normal respiratory effort. Examination of his right shoulder he has about 160 degrees of forward elevation.  He has positive impingement findings he can only internally rotate to below his belt line.  Abduction strength is fairly good.  No swelling in the shoulder joint no erythema  Imaging: No results found. No images are attached to the encounter.  Labs: Lab Results  Component Value Date   REPTSTATUS 12/03/2017 FINAL 11/27/2017   CULT  11/27/2017    NO GROWTH 5 DAYS Performed at [facility], [address], [place], [place] [address]      Lab Results  Component Value Date   ALBUMIN 4.2 11/27/2017   ALBUMIN 4.1 12/21/2011    No results found for: MG No results found for: VD25OH  No results found for:  PREALBUMIN CBC EXTENDED Latest Ref Rng & Units 11/24/2019 11/27/2017 11/27/2017  WBC 4.0 - 10.5 K/uL 4.1 15.1(H) 13.2(H)  RBC 4.22 - 5.81 MIL/uL 4.95 4.80 4.66  HGB 13.0 - 17.0 g/dL 81.2 75.1 70.0  HCT 17.4 - 52.0 % 43.3 41.7 42.0  PLT 150 - 400 K/uL 207 341 306  NEUTROABS 1.7 - 7.7 K/uL 2.9 10.6(H) 9.0(H)  LYMPHSABS 0.7 - 4.0 K/uL 0.6(L) 2.4 2.2     There is no height or weight on file to calculate BMI.  Orders:  Orders Placed This Encounter  Procedures   MR SHOULDER RIGHT WO CONTRAST   No orders of the defined types were placed in this encounter.    Procedures: No procedures performed  Clinical Data: No additional findings.  ROS:  All other systems negative, except as noted in the HPI. Review of Systems  Objective: Vital Signs: There were no vitals taken for this visit.  Specialty Comments:  No specialty comments available.  PMFS History: Patient Active Problem List   Diagnosis Date Noted   COLONIC POLYPS, HX OF 12/26/2006   Past Medical History:  Diagnosis Date   Kidney stone     Family History  Family history unknown: Yes    No past surgical history on file. Social History   Occupational History   Not on file  Tobacco Use   Smoking status: Never   Smokeless tobacco: Not on file  Substance and Sexual Activity   Alcohol use: No   Drug use: No   Sexual activity: Yes

## 2020-12-31 ENCOUNTER — Telehealth: Payer: Self-pay | Admitting: Orthopedic Surgery

## 2020-12-31 NOTE — Telephone Encounter (Signed)
Called pt back and advised him to call [facility] to set up appt. Number was given and he will be calling to schedule

## 2020-12-31 NOTE — Telephone Encounter (Signed)
Pt checking on status of mri order from 12/18/20 and wanted to know if there was anything he needed to do on his end. The best call back number is [phone].

## 2021-01-11 ENCOUNTER — Ambulatory Visit
Admission: RE | Admit: 2021-01-11 | Discharge: 2021-01-11 | Disposition: A | Discharge status: Home / Self Care | Payer: 59 | Source: Ambulatory Visit | Attending: Physician Assistant | Admitting: Physician Assistant

## 2021-01-11 DIAGNOSIS — G8929 Other chronic pain: Secondary | ICD-10-CM

## 2021-01-13 ENCOUNTER — Ambulatory Visit (INDEPENDENT_AMBULATORY_CARE_PROVIDER_SITE_OTHER): Payer: 59 | Admitting: Family

## 2021-01-13 ENCOUNTER — Encounter: Payer: Self-pay | Admitting: Family

## 2021-01-13 DIAGNOSIS — G8929 Other chronic pain: Secondary | ICD-10-CM

## 2021-01-13 DIAGNOSIS — M25511 Pain in right shoulder: Secondary | ICD-10-CM | POA: Diagnosis not present

## 2021-01-13 NOTE — Progress Notes (Signed)
Office Visit Note   Patient: Neil Padilla           Date of Birth: Mar 04, 1961           MRN: [id number] Visit Date: 01/13/2021              Requested by: Kathyrn Sheriff, MD [address] [id number] [facility]el [address] [place],  [place] [address] PCP: Kathyrn Sheriff, MD  Chief Complaint  Patient presents with   Right Shoulder - Follow-up    MRI review      HPI: The patient is a 60 year old gentleman seen for MRI review of the right shoulder. Has been ongoing for last 3 months or so. Is gradually improving.   Had Depo-Medrol injection without relief. Continues to have some loss of range of motion due to pain.  Pain reaching behind back and above head  Assessment & Plan: Visit Diagnoses: No diagnosis found.  Plan: MRI scan and right shoulder reviewed today with patient.  Discussed possibility of arthroscopy with debridement.  Patient would like to hold off on surgical intervention.  He prefers to discuss this with Dr. Lajoyce Corners.  He will follow-up in 2 weeks to do so.  Follow-Up Instructions: No follow-ups on file.   Right Shoulder Exam   Tenderness  The patient is experiencing tenderness in the biceps tendon.  Tests  Drop arm: negative     Patient is alert, oriented, no adenopathy, well-dressed, normal affect, normal respiratory effort. On examination of the right shoulder.  He has full passive range of motion.  Imaging: No results found. No images are attached to the encounter.  Labs: Lab Results  Component Value Date   REPTSTATUS 12/03/2017 FINAL 11/27/2017   CULT  11/27/2017    NO GROWTH 5 DAYS Performed at [facility], [address], [place], [place] [address]      Lab Results  Component Value Date   ALBUMIN 4.2 11/27/2017   ALBUMIN 4.1 12/21/2011    No results found for: MG No results found for: VD25OH  No results found for: PREALBUMIN CBC EXTENDED Latest Ref Rng & Units 11/24/2019 11/27/2017 11/27/2017  WBC 4.0 - 10.5 K/uL 4.1 15.1(H) 13.2(H)   RBC 4.22 - 5.81 MIL/uL 4.95 4.80 4.66  HGB 13.0 - 17.0 g/dL 00.9 38.1 82.9  HCT 93.7 - 52.0 % 43.3 41.7 42.0  PLT 150 - 400 K/uL 207 341 306  NEUTROABS 1.7 - 7.7 K/uL 2.9 10.6(H) 9.0(H)  LYMPHSABS 0.7 - 4.0 K/uL 0.6(L) 2.4 2.2     There is no height or weight on file to calculate BMI.  Orders:  No orders of the defined types were placed in this encounter.  No orders of the defined types were placed in this encounter.    Procedures: No procedures performed  Clinical Data: No additional findings.  ROS:  All other systems negative, except as noted in the HPI. Review of Systems  Constitutional:  Negative for chills and fever.  Musculoskeletal:  Positive for arthralgias and myalgias.  Neurological:  Negative for weakness and numbness.   Objective: Vital Signs: There were no vitals taken for this visit.  Specialty Comments:  No specialty comments available.  PMFS History: Patient Active Problem List   Diagnosis Date Noted   COLONIC POLYPS, HX OF 12/26/2006   Past Medical History:  Diagnosis Date   Kidney stone     Family History  Family history unknown: Yes    History reviewed. No pertinent surgical history.  Social History   Occupational History   Not on file  Tobacco Use   Smoking status: Never   Smokeless tobacco: Not on file  Substance and Sexual Activity   Alcohol use: No   Drug use: No   Sexual activity: Yes

## 2021-01-26 ENCOUNTER — Other Ambulatory Visit: Payer: Self-pay

## 2021-01-26 ENCOUNTER — Ambulatory Visit (INDEPENDENT_AMBULATORY_CARE_PROVIDER_SITE_OTHER): Payer: 59 | Admitting: Orthopedic Surgery

## 2021-01-26 ENCOUNTER — Encounter: Payer: Self-pay | Admitting: Orthopedic Surgery

## 2021-01-26 DIAGNOSIS — G8929 Other chronic pain: Secondary | ICD-10-CM | POA: Diagnosis not present

## 2021-01-26 DIAGNOSIS — M25511 Pain in right shoulder: Secondary | ICD-10-CM

## 2021-01-26 NOTE — Progress Notes (Signed)
Office Visit Note   Patient: Neil Padilla           Date of Birth: 12/03/1960           MRN: [id number] Visit Date: 01/26/2021              Requested by: Kathyrn Sheriff, MD [id number] [facility] [address] [place],  [place] [address] PCP: Kathyrn Sheriff, MD  Chief Complaint  Patient presents with   Right Shoulder - Follow-up    Wants opinion on surgery      HPI: Patient is a 60 year old gentleman who presents in follow-up for his right shoulder.  He states he is try doing some exercises that he saw on the lab and his shoulder is a little better.  Assessment & Plan: Visit Diagnoses:  1. Chronic right shoulder pain     Plan: Reviewed the MRI findings and feel the patient may benefit from physical therapy before considering arthroscopic intervention.  We will set up with physical therapy upstairs reevaluate in 2 months.  Discussed that if there is insufficient relief with the therapy we could consider arthroscopic debridement.  Follow-Up Instructions: Return in about 2 months (around 03/28/2021).   Ortho Exam  Patient is alert, oriented, no adenopathy, well-dressed, normal affect, normal respiratory effort. Examination patient has good range of motion of the right shoulder he does have pain with Neer and Hawkins impingement test.  Review of the MRI scan shows some small degenerative tearing of the supraspinatus as well as degenerative tearing of the subscapularis.  There is also some interstitial tearing of the long head of the biceps.  Imaging: No results found. No images are attached to the encounter.  Labs: Lab Results  Component Value Date   REPTSTATUS 12/03/2017 FINAL 11/27/2017   CULT  11/27/2017    NO GROWTH 5 DAYS Performed at [facility], [address] [place], [place] [address]      Lab Results  Component Value Date   ALBUMIN 4.2 11/27/2017   ALBUMIN 4.1 12/21/2011    No results found for: MG No results found for: VD25OH  No  results found for: PREALBUMIN CBC EXTENDED Latest Ref Rng & Units 11/24/2019 11/27/2017 11/27/2017  WBC 4.0 - 10.5 K/uL 4.1 15.1(H) 13.2(H)  RBC 4.22 - 5.81 MIL/uL 4.95 4.80 4.66  HGB 13.0 - 17.0 g/dL 99.3 57.0 17.7  HCT 93.9 - 52.0 % 43.3 41.7 42.0  PLT 150 - 400 K/uL 207 341 306  NEUTROABS 1.7 - 7.7 K/uL 2.9 10.6(H) 9.0(H)  LYMPHSABS 0.7 - 4.0 K/uL 0.6(L) 2.4 2.2     There is no height or weight on file to calculate BMI.  Orders:  No orders of the defined types were placed in this encounter.  No orders of the defined types were placed in this encounter.    Procedures: No procedures performed  Clinical Data: No additional findings.  ROS:  All other systems negative, except as noted in the HPI. Review of Systems  Objective: Vital Signs: There were no vitals taken for this visit.  Specialty Comments:  No specialty comments available.  PMFS History: Patient Active Problem List   Diagnosis Date Noted   COLONIC POLYPS, HX OF 12/26/2006   Past Medical History:  Diagnosis Date   Kidney stone     Family History  Family history unknown: Yes    History reviewed. No pertinent surgical history. Social History   Occupational History   Not on file  Tobacco  Use   Smoking status: Never   Smokeless tobacco: Not on file  Substance and Sexual Activity   Alcohol use: No   Drug use: No   Sexual activity: Yes

## 2021-02-03 ENCOUNTER — Encounter: Payer: Self-pay | Admitting: Physical Therapy

## 2021-02-03 ENCOUNTER — Other Ambulatory Visit: Payer: Self-pay

## 2021-02-03 ENCOUNTER — Ambulatory Visit (INDEPENDENT_AMBULATORY_CARE_PROVIDER_SITE_OTHER): Payer: 59 | Admitting: Physical Therapy

## 2021-02-03 ENCOUNTER — Ambulatory Visit: Payer: 59 | Admitting: Physical Therapy

## 2021-02-03 DIAGNOSIS — M6281 Muscle weakness (generalized): Secondary | ICD-10-CM

## 2021-02-03 DIAGNOSIS — R293 Abnormal posture: Secondary | ICD-10-CM

## 2021-02-03 DIAGNOSIS — M25511 Pain in right shoulder: Secondary | ICD-10-CM

## 2021-02-03 DIAGNOSIS — M25611 Stiffness of right shoulder, not elsewhere classified: Secondary | ICD-10-CM

## 2021-02-03 NOTE — Addendum Note (Signed)
Addended by: Jules Husbands MARIE L on: 02/03/2021 11:03 PM   Modules accepted: Orders

## 2021-02-03 NOTE — Patient Instructions (Signed)
Access Code: [email or web address] URL: [email or web address] Date: 02/03/2021 Prepared by: Vernon Prey April Kirstie Peri  Exercises Sleeper Stretch - 2 x daily - 7 x weekly - 2 sets - 30 sec hold Doorway Pec Stretch at 90 Degrees Abduction - 2 x daily - 7 x weekly - 1 sets - 30 sec hold Shoulder External Rotation and Scapular Retraction with Resistance - 1 x daily - 7 x weekly - 1-3 sets - 10 reps - 3 sec hold Standing Isometric Shoulder Internal Rotation at Doorway - 1 x daily - 7 x weekly - 3 sets - 10 reps - 5 sec hold Seated Shoulder Abduction Towel Slide at Table Top - 1 x daily - 7 x weekly - 1 sets - 10 reps - 5 sec hold

## 2021-02-03 NOTE — Therapy (Signed)
[facility] Physical Therapy [address] [place], [place], [address] Phone: [phone]   Fax:  [phone]  Physical Therapy Evaluation  Patient Details  Name: Neil Padilla MRN: [id number] Date of Birth: 07/25/1960 Referring Provider (PT): Neil Mustard, MD   Encounter Date: 02/03/2021   PT End of Session - 02/03/21 1254     Visit Number 1    Number of Visits 13    Date for PT Re-Evaluation 03/17/21    Authorization Type [employer]    PT Start Time 1107    PT Stop Time 1145    PT Time Calculation (min) 38 min    Activity Tolerance Patient tolerated treatment well    Behavior During Therapy [facility] for tasks assessed/performed             Past Medical History:  Diagnosis Date   Kidney stone     History reviewed. No pertinent surgical history.  There were no vitals filed for this visit.    Subjective Assessment - 02/03/21 1112     Subjective Pt states he pulled a piece of wood and felt a terrible pain in his right shoulder ~2 months ago. Pt notes difficulty with showers reaching behind him. Pt states it is feeling a lot better now by ~40%.    Limitations Lifting;House hold activities    How long can you sit comfortably? n/a    How long can you stand comfortably? n/a    How long can you walk comfortably? n/a    Diagnostic tests MRI: small degenerative tearing of supraspinatus as well as degenerative tearing of the subscapularis.  There is also some interstitial tearing of the long head of the biceps.    Patient Stated Goals Improve pain for ADLs and work tasks    Currently in Pain? 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                [facility] PT Assessment - 02/03/21 0001       Assessment   Medical Diagnosis M25.511,G89.29 (ICD-10-CM) - Chronic right shoulder pain    Referring Provider (PT) Neil Mustard, MD    Onset Date/Surgical Date --   3 months ago   Hand Dominance Right    Prior Therapy None      Precautions   Precautions None      Restrictions   Weight Bearing Restrictions No      Balance Screen   Has the patient fallen in the past 6 months No      Home Environment   Living Environment Private residence      Prior Function   Level of Independence Independent    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      Observation/Other Assessments   Focus on Therapeutic Outcomes (FOTO)  TBA      Posture/Postural Control   Posture/Postural Control Postural limitations    Postural Limitations Rounded Shoulders;Forward head      ROM / Strength  AROM / PROM / Strength AROM;Strength      AROM   AROM Assessment Site Shoulder;Elbow    Right/Left Shoulder Right;Left    Right Shoulder Flexion 120 Degrees    Right Shoulder ABduction 100 Degrees    Right Shoulder Internal Rotation --   L4   Right Shoulder External Rotation --   Just to behind the top of his head   Left Shoulder Flexion 150 Degrees    Left Shoulder ABduction 125 Degrees    Left Shoulder Internal Rotation --   T10   Left Shoulder External Rotation --   T5     Strength   Strength Assessment Site Shoulder    Right/Left Shoulder Right;Left    Right Shoulder Flexion 4+/5    Right Shoulder Extension 5/5    Right Shoulder ABduction 3+/5    Right Shoulder Internal Rotation 3/5    Right Shoulder External Rotation 4/5    Right Shoulder Horizontal ABduction 4/5   painful   Right Shoulder Horizontal ADduction 4/5   painful     Palpation   Palpation comment TTP anterior shoulder; glenohumeral mostly WFL with some posterior joint  tightness. TTP and taut R UT vs L                        Objective measurements completed on examination: See above findings.       [facility] Adult PT Treatment/Exercise - 02/03/21 0001       Exercises   Exercises Shoulder      Shoulder Exercises: Seated   Abduction AAROM;Right;10 reps    ABduction Limitations Seated next to table      Shoulder Exercises: Standing   External Rotation Strengthening;Both;10 reps    Theraband Level (Shoulder External Rotation) Level 2 (Red)    Internal Rotation Strengthening;Right;10 reps   5 sec isometric hold     Shoulder Exercises: Stretch   Other Shoulder Stretches sleeper stretch 3x10 sec      Modalities   Modalities Iontophoresis      Iontophoresis   Type of Iontophoresis Dexamethasone    Location R shoulder    Dose 80 mA/min    Time 4 hrs                     PT Education - 02/03/21 1253     Education Details Exam findings, HEP, and POC. Reiterated to pt to perform exercises gently and just before the point of pain.    Person(s) Educated Patient    Methods Explanation;Demonstration;Tactile cues;Verbal cues;Handout    Comprehension Verbalized understanding;Returned demonstration;Verbal cues required;Tactile cues required                 PT Long Term Goals - 02/03/21 2222       PT LONG TERM GOAL #1   Title Pt will be independent with HEP    Time 6    Period Weeks    Status New    Target Date 03/17/21      PT LONG TERM GOAL #2   Title Pt will demo improved shoulder abduction and flexion by at least 20 deg and IR to at least L1    Time 6    Period Weeks    Status New    Target Date 03/17/21      PT LONG TERM GOAL #3   Title Pt will be able to lift overhead at least 5# with </=2/10 pain    Time 6  Period Weeks    Status New    Target Date 03/17/21      PT LONG TERM GOAL #4   Title Pt will report at least 90% return to normal function    Time 6    Period Weeks    Status New     Target Date 03/17/21                    Plan - 02/03/21 1254     Clinical Impression Statement Neil Padilla is a 60 y/o M presenting to OPPT due to R shoulder pain s/p pulling on a piece of wood. MRI indicates small degenerative tear of supraspinatus and subscapularis and long head of biceps. Pt demonstrates limited shoulder IR, ER, and abduction AROM, weakness, and pain limiting lifting and carrying for work and home tasks. Pt would benefit from PT to address these issues for return to PLOF. Pt would benefit from further education for his condition and to only perform exercises to point of pain but not past it.    Personal Factors and Comorbidities Age;Time since onset of injury/illness/exacerbation    Examination-Activity Limitations Bathing;Lift;Dressing;Reach Overhead    Examination-Participation Restrictions Cleaning;Yard Work    Stability/Clinical Decision Making Stable/Uncomplicated    Clinical Decision Making Low    Rehab Potential Good    PT Frequency 2x / week    PT Duration 6 weeks    PT Treatment/Interventions ADLs/Self Care Home Management;Cryotherapy;Electrical Stimulation;Iontophoresis 4mg /ml Dexamethasone;Moist Heat;Ultrasound;Functional mobility training;Therapeutic activities;Therapeutic exercise;Neuromuscular re-education;Manual techniques;Patient/family education;Passive range of motion;Dry needling;Taping;Vasopneumatic Device    PT Next Visit Plan Review and modify HEP as able. Gentle shoulder AAROM, iso and strengthening as able.    PT Home Exercise Plan Access Code [email or web address]    Consulted and Agree with Plan of Care Patient             Patient will benefit from skilled therapeutic intervention in order to improve the following deficits and impairments:  Decreased range of motion, Increased fascial restricitons, Impaired UE functional use, Pain, Improper body mechanics, Decreased mobility, Decreased strength, Postural dysfunction, Decreased endurance  Visit  Diagnosis: Acute pain of right shoulder  Stiffness of right shoulder, not elsewhere classified  Muscle weakness (generalized)  Abnormal posture     Problem List Patient Active Problem List   Diagnosis Date Noted   COLONIC POLYPS, HX OF 12/26/2006    [facility] April May, PT, DPT 02/03/2021, 10:45 PM  [facility] Physical Therapy [address] [place], [place], [place] Phone: [phone]   Fax:  [phone]  Name: Neil Padilla MRN: Darien Ramus Date of Birth: [birth year]

## 2021-02-13 ENCOUNTER — Telehealth: Payer: Self-pay | Admitting: Rehabilitative and Restorative Service Providers"

## 2021-02-13 ENCOUNTER — Encounter: Payer: 59 | Admitting: Rehabilitative and Restorative Service Providers"

## 2021-02-13 NOTE — Telephone Encounter (Signed)
Called and left message about missed visit today.  No more scheduled visits noted at this time.  Left call back number.  Chyrel Masson, PT, DPT, OCS, ATC 02/13/21  9:57 AM

## 2021-04-02 ENCOUNTER — Ambulatory Visit: Payer: 59 | Admitting: Orthopedic Surgery

## 2021-12-21 ENCOUNTER — Encounter: Payer: Self-pay | Admitting: Neurology

## 2021-12-21 ENCOUNTER — Ambulatory Visit (INDEPENDENT_AMBULATORY_CARE_PROVIDER_SITE_OTHER): Payer: Commercial Managed Care - HMO | Admitting: Neurology

## 2021-12-21 VITALS — BP 107/74 | HR 84 | Ht 66.0 in | Wt 167.6 lb

## 2021-12-21 DIAGNOSIS — R0981 Nasal congestion: Secondary | ICD-10-CM

## 2021-12-21 DIAGNOSIS — G4730 Sleep apnea, unspecified: Secondary | ICD-10-CM | POA: Diagnosis not present

## 2021-12-21 DIAGNOSIS — R0683 Snoring: Secondary | ICD-10-CM | POA: Diagnosis not present

## 2021-12-21 MED ORDER — MOMETASONE FUROATE 50 MCG/ACT NA SUSP: 2.0000 | Freq: Every day | NASAL | 2 refills | Status: AC

## 2021-12-21 NOTE — Patient Instructions (Signed)
Screening for Sleep Apnea  Sleep apnea is a condition in which breathing pauses or becomes shallow during sleep. Sleep apnea screening is a test to determine if you are at risk for sleep apnea. The test includes a series of questions. It will only takes a few minutes. Your health care provider may ask you to have this test in preparation for surgery or as part of a physical exam. What are the symptoms of sleep apnea? Common symptoms of sleep apnea include: Snoring. Waking up often at night. Daytime sleepiness. Pauses in breathing. Choking or gasping during sleep. Irritability. Forgetfulness. Trouble thinking clearly. Depression. Personality changes. Most people with sleep apnea do not know that they have it. What are the advantages of sleep apnea screening? Getting screened for sleep apnea can help: Ensure your safety. It is important for your health care providers to know whether or not you have sleep apnea, especially if you are having surgery or have other long-term (chronic) health conditions. Improve your health and allow you to get a better night's rest. Restful sleep can help you: Have more energy. Lose weight. Improve high blood pressure. Improve diabetes management. Prevent stroke. Prevent car accidents. What happens during the screening? Screening usually includes being asked a list of questions about your sleep quality. Some questions you may be asked include: Do you snore? Is your sleep restless? Do you have daytime sleepiness? Has a partner or spouse told you that you stop breathing during sleep? Have you had trouble concentrating or memory loss? What is your age? What is your neck circumference? To measure your neck, keep your back straight and gently wrap the tape measure around your neck. Put the tape measure at the middle of your neck, between your chin and collarbone. What is your sex assigned at birth? Do you have or are you being treated for high blood  pressure? If your screening test is positive, you are at risk for the condition. Further testing may be needed to confirm a diagnosis of sleep apnea. Where to find more information You can find screening tools online or at your health care clinic. For more information about sleep apnea screening and healthy sleep, visit these websites: Centers for Disease Control and Prevention: www.cdc.gov American Sleep Apnea Association: www.sleepapnea.org Contact a health care provider if: You think that you may have sleep apnea. Summary Sleep apnea screening can help determine if you are at risk for sleep apnea. It is important for your health care providers to know whether or not you have sleep apnea, especially if you are having surgery or have other chronic health conditions. You may be asked to take a screening test for sleep apnea in preparation for surgery or as part of a physical exam. This information is not intended to replace advice given to you by your health care provider. Make sure you discuss any questions you have with your health care provider. Document Revised: 02/22/2020 Document Reviewed: 02/22/2020 Elsevier Patient Education  2023 [employer]  

## 2021-12-21 NOTE — Progress Notes (Signed)
SLEEP MEDICINE CLINIC    Provider:  Larey Seat, MD  Primary Care Physician:    Coralie Keens, MD [id number] [facility] [place] [address]     Referring Provider: Fanny Bien, Md [address] [place],  [place] [address]          Chief Complaint according to patient   Patient presents with:     New Patient (Initial Visit)           HISTORY OF PRESENT ILLNESS:  Neil Padilla  ( 'Ehrlich') is a 61 y.o. Caucasian male patient seen here as a referral on 12/21/2021 from Dr Ernie Hew for a sleep evaluation.   Chief concern according to patient :  "My wife thinks I snore , she witnessed apnea. " I feel not sleepy or fatigued, I have no trouble staying awake .    I have the pleasure of seeing Neil Padilla today, a right-handed shoemaker, cobbler ,from [place] with a possible sleep disorder, he has a past medical history of Kidney stone.    Sleep relevant medical history: Nocturia- rare , no Sleep walking, no Tonsillectomy, no deviated septum.   Family medical /sleep history: no other family member on CPAP with OSA. eldest brother of 3 a passed away from DM complication.    Social history:  Patient is working as  a Data processing manager, Office manager, and lives in a household with spouse,  the couple has 3 sons, all grown. 7 grandchildren in [place] and [place].  The patient currently works in day time.  Tobacco use- never .  ETOH use ; rare ,  Caffeine intake in form of Coffee( 3-4 a week ) Soda( rare) Tea ( hot tea,  3/ week) no energy drinks. Dark chocolate.  Regular exercise in form of  working.       Sleep habits are as follows: The patient's dinner time is between 5.30-6.30 PM. The patient goes to bed at 9-10 PM and continues to sleep for several  hours,  he denies waking  for bathroom breaks.   The preferred sleep position is laterally and supine , with the support of 1-2 pillows. Snoring when supine.  Dreams are reportedly frequent.  7  AM is the usual  rise time. The patient wakes up spontaneously.  He reports feeling refreshed or restored in AM, with rare symptoms such as dry mouth, morning headaches, and residual fatigue.  Naps are not taken. He has to feel sickly to nap, and if he naps its for 10 minutes.     Review of Systems: Out of a complete 14 system review, the patient complains of only the following symptoms, and all other reviewed systems are negative.:  snoring,   How likely are you to doze in the following situations: 0 = not likely, 1 = slight chance, 2 = moderate chance, 3 = high chance   Sitting and Reading? Watching Television? Sitting inactive in a public place (theater or meeting)? As a passenger in a car for an hour without a break? Lying down in the afternoon when circumstances permit? Sitting and talking to someone? Sitting quietly after lunch without alcohol? In a car, while stopped for a few minutes in traffic?   Total = 3/ 24 points   FSS endorsed at 11/ 63 points.  He did not answer, I filled this with his information.   GDS never answered.   Social History   Socioeconomic History   Marital status: Married  Spouse name: Neil Padilla   Number of children:  3 sons    Years of education: Not on file   Highest education level: Not on file  Occupational History   Not on file  Tobacco Use   Smoking status: Never   Smokeless tobacco: Not on file  Substance and Sexual Activity   Alcohol use: No   Drug use: No   Sexual activity: Yes  Other Topics Concern   Not on file  Social History Narrative   Not on file   Social Determinants of Health   Financial Resource Strain: Not on file  Food Insecurity: Not on file  Transportation Needs: Not on file  Physical Activity: Not on file  Stress: Not on file  Social Connections: Not on file    Family History  Family history unknown: Yes    Past Medical History:  Diagnosis Date   Kidney stone     History reviewed. No pertinent surgical history.    Current Outpatient Medications on File Prior to Visit  Medication Sig Dispense Refill   acetaminophen (TYLENOL) 500 MG tablet Take 1,000 mg by mouth every 6 (six) hours as needed for mild pain.     ibuprofen (ADVIL,MOTRIN) 200 MG tablet Take 400 mg by mouth every 6 (six) hours as needed for mild pain.     predniSONE (DELTASONE) 50 MG tablet Take 1 tablet (50 mg total) by mouth daily. 5 tablet 0   prochlorperazine (COMPAZINE) 10 MG tablet Take 1 tablet (10 mg total) by mouth every 6 (six) hours as needed for nausea or vomiting. 20 tablet 0   No current facility-administered medications on file prior to visit.    No Known Allergies  Physical exam:  Today's Vitals   12/21/21 1454  BP: 107/74  Pulse: 84  Weight: 167 lb 9.6 oz (76 kg)  Height: 5\' 6"  (1.676 m)   Body mass index is 27.05 kg/m.   Wt Readings from Last 3 Encounters:  12/21/21 167 lb 9.6 oz (76 kg)  10/14/20 156 lb (70.8 kg)  11/27/17 156 lb (70.8 kg)     Ht Readings from Last 3 Encounters:  12/21/21 5\' 6"  (1.676 m)  10/14/20 5\' 9"  (1.753 m)  11/27/17 5\' 9"  (1.753 m)      General: The patient is awake, alert and appears not in acute distress. Head: Normocephalic, atraumatic. Neck is supple.  Mallampati 2,  neck circumference:16 inches . Nasal airflow restricted , not fully  patent.  Retrognathia is  seen.  Dental status: crowded lower jaw.  Cardiovascular:  Regular rate and cardiac rhythm by pulse,  without distended neck veins. Respiratory: Lungs are clear to auscultation.  Skin:  Without evidence of ankle edema, or rash. Trunk: The patient's posture is erect.   Neurologic exam : The patient is awake and alert, oriented to place and time.   Memory subjective described as intact.  Attention span & concentration ability appears normal.  Speech is fluent,  without  dysarthria, dysphonia or aphasia.  Mood and affect are appropriate.   Cranial nerves: no loss of smell or taste reported  Pupils are equal and  briskly reactive to light. Funduscopic exam deferred. .  Extraocular movements in vertical and horizontal planes were intact and without nystagmus. No Diplopia. Visual fields by finger perimetry are intact. Hearing was intact to soft voice and finger rubbing.   Facial sensation intact to fine touch.  Facial motor strength is symmetric and tongue and uvula move midline.  Neck ROM :  rotation, tilt and flexion extension were normal for age and shoulder shrug was symmetrical.    Motor exam:  Symmetric bulk, tone and ROM.   Normal tone without cog wheeling, symmetric grip strength .   Sensory:  Fine touch, pinprick and vibration were tested  and  normal.  Proprioception tested in the upper extremities was normal.   Coordination: Rapid alternating movements in the fingers/hands were of normal speed.  The Finger-to-nose maneuver was intact without evidence of ataxia, dysmetria or tremor.   Gait and station: Patient could rise unassisted from a seated position, walked without assistive device.  Stance is of normal width/ base .  Toe and heel walk were deferred.  Deep tendon reflexes: in the  upper and lower extremities are symmetric and intact.  Babinski response was deferred      After spending a total time of  40  minutes face to face and additional time for physical and neurologic examination, review of laboratory studies,  personal review of imaging studies, reports and results of other testing and review of referral information / records as far as provided in visit, I have established the following assessments:  Mr Press reports no problems or concerns with his sleep , but is here " to appease the wife "  1) Mr. Bonney Roussel is here to be evaluated for witnessed episodes of apnea as reported by his wife, he does not have physical risk factors that I normally see, he does have a normal body mass index, normal neck size, and only slightly increased upper airway Mallampati.   He has biological teeth  regular, mild retrognathia.  The concern is more that his nasal passage may be too congested to allow him to not breathe through the mouth at night.    My Plan is to proceed with:  1) I will order a home sleep test for the patient to evaluate and screen for sleep apnea. Should he have apnea it would be beneficial for him to have an easier nasal airflow and I may have to prescribe an antiallergy medicine or a nasal spray.    I would like to thank Coralie Keens, MD and Fanny Bien, Md [address] [place],  [place] [address] for allowing me to meet with and to take care of this pleasant patient.   In short, Cambren Lycans is presenting with snoring and witnessed apnea, nasal airway congestion. I plan to follow up either personally or through our NP within 3-4  months.   CC: I will share my notes with PCP .  Electronically signed by: Larey Seat, MD 12/21/2021 3:18 PM  [facility] and [facility] Sleep Board certified by The [employer] of Sleep Medicine and Diplomate of the [employer] of Sleep Medicine. Board certified In Neurology through the [place], Fellow of the [employer] of Neurology. Medical Director of [employer].

## 2022-02-04 ENCOUNTER — Telehealth: Payer: Self-pay | Admitting: Neurology

## 2022-02-04 NOTE — Telephone Encounter (Signed)
HST- [employer] no auth req ref # [id number].  Patient is scheduled at [facility] for 1128/23 at 10:30 AM.  Mailed packet to the patient.

## 2022-04-25 ENCOUNTER — Other Ambulatory Visit: Payer: Self-pay

## 2022-04-25 ENCOUNTER — Emergency Department (HOSPITAL_BASED_OUTPATIENT_CLINIC_OR_DEPARTMENT_OTHER)
Admission: EM | Admit: 2022-04-25 | Discharge: 2022-04-25 | Disposition: A | Discharge status: Home / Self Care | Payer: 59 | Attending: Emergency Medicine | Admitting: Emergency Medicine

## 2022-04-25 ENCOUNTER — Emergency Department (HOSPITAL_BASED_OUTPATIENT_CLINIC_OR_DEPARTMENT_OTHER): Payer: 59 | Admitting: Radiology

## 2022-04-25 ENCOUNTER — Encounter (HOSPITAL_BASED_OUTPATIENT_CLINIC_OR_DEPARTMENT_OTHER): Payer: Self-pay | Admitting: Emergency Medicine

## 2022-04-25 DIAGNOSIS — M546 Pain in thoracic spine: Secondary | ICD-10-CM | POA: Insufficient documentation

## 2022-04-25 DIAGNOSIS — X509XXA Other and unspecified overexertion or strenuous movements or postures, initial encounter: Secondary | ICD-10-CM | POA: Insufficient documentation

## 2022-04-25 DIAGNOSIS — M549 Dorsalgia, unspecified: Secondary | ICD-10-CM | POA: Diagnosis not present

## 2022-04-25 DIAGNOSIS — G8911 Acute pain due to trauma: Secondary | ICD-10-CM | POA: Diagnosis not present

## 2022-04-25 MED ORDER — OXYCODONE HCL 5 MG PO TABS
5.0000 mg | ORAL_TABLET | Freq: Once | ORAL | Status: AC
  Administered 2022-04-25: 5 mg via ORAL
  Filled 2022-04-25: qty 1

## 2022-04-25 MED ORDER — METHOCARBAMOL 500 MG PO TABS: 500.0000 mg | ORAL_TABLET | Freq: Two times a day (BID) | ORAL | 0 refills | Status: AC

## 2022-04-25 MED ORDER — KETOROLAC TROMETHAMINE 15 MG/ML IJ SOLN
15.0000 mg | Freq: Once | INTRAMUSCULAR | Status: AC
  Administered 2022-04-25: 15 mg via INTRAMUSCULAR
  Filled 2022-04-25: qty 1

## 2022-04-25 MED ORDER — ACETAMINOPHEN 500 MG PO TABS
1000.0000 mg | ORAL_TABLET | Freq: Once | ORAL | Status: AC
  Administered 2022-04-25: 1000 mg via ORAL
  Filled 2022-04-25: qty 2

## 2022-04-25 NOTE — Discharge Instructions (Signed)
Your x-ray did not show pneumonia.  Please follow-up with your family doctor in the office.  Please return for difficulty breathing if you develop fever return for radiation of the pain to your legs.  Return for chest pain or abdominal pain.  Take 4 over the counter ibuprofen tablets 3 times a day or 2 over-the-counter naproxen tablets twice a day for pain. Also take tylenol 1000mg (2 extra strength) four times a day.

## 2022-04-25 NOTE — ED Triage Notes (Signed)
Middle back swollen, spasms, started about 3 days, pt was working on his car, under car for 3 hours, strained .

## 2022-04-25 NOTE — ED Provider Notes (Signed)
[facility] Provider Note   CSN: [id number] Arrival date & time: 04/25/22  0831     History  No chief complaint on file.   Neil Padilla is a 62 y.o. male.  62 yo M with a chief complaints of back pain.  This is bilateral and has been going on for a couple weeks.  Occurred initially after he was working on his car and then ended up working on his car a little bit more yesterday.  He developed a bit of a cough over the past few days but is much better today.  He thought maybe he had pneumonia and so took some antibiotics for the past couple days and thinks that made it a little bit better.  No fevers or chills.  No difficulty breathing.  No trauma.  No radiation of the pain.  He points to the thoracic back bilaterally.        Home Medications Prior to Admission medications   Medication Sig Start Date End Date Taking? Authorizing Provider  methocarbamol (ROBAXIN) 500 MG tablet Take 1 tablet (500 mg total) by mouth 2 (two) times daily. 04/25/22  Yes Deno Etienne, DO  acetaminophen (TYLENOL) 500 MG tablet Take 1,000 mg by mouth every 6 (six) hours as needed for mild pain.    [provider]  ibuprofen (ADVIL,MOTRIN) 200 MG tablet Take 400 mg by mouth every 6 (six) hours as needed for mild pain.    [provider]  mometasone (NASONEX) 50 MCG/ACT nasal spray Place 2 sprays into the nose daily. 12/21/21   Dohmeier, Asencion Partridge, MD  predniSONE (DELTASONE) 50 MG tablet Take 1 tablet (50 mg total) by mouth daily. 9/76/73   Delora Fuel, MD  prochlorperazine (COMPAZINE) 10 MG tablet Take 1 tablet (10 mg total) by mouth every 6 (six) hours as needed for nausea or vomiting. 07/16/35   Delora Fuel, MD      Allergies    Patient has no known allergies.    Review of Systems   Review of Systems  Physical Exam Updated Vital Signs BP 118/68 (BP Location: Right Arm)   Pulse 67   Temp 98.6 F (37 C) (Oral)   Resp 16   SpO2 98%  Physical  Exam Vitals and nursing note reviewed.  Constitutional:      Appearance: He is well-developed.  HENT:     Head: Normocephalic and atraumatic.  Eyes:     Pupils: Pupils are equal, round, and reactive to light.  Neck:     Vascular: No JVD.  Cardiovascular:     Rate and Rhythm: Normal rate and regular rhythm.     Heart sounds: No murmur heard.    No friction rub. No gallop.  Pulmonary:     Effort: No respiratory distress.     Breath sounds: No wheezing.  Abdominal:     General: There is no distension.     Tenderness: There is no abdominal tenderness. There is no guarding or rebound.  Musculoskeletal:        General: Normal range of motion.     Cervical back: Normal range of motion and neck supple.  Skin:    Coloration: Skin is not pale.     Findings: No rash.  Neurological:     Mental Status: He is alert and oriented to person, place, and time.  Psychiatric:        Behavior: Behavior normal.     ED Results / Procedures / Treatments  Labs (all labs ordered are listed, but only abnormal results are displayed) Labs Reviewed - No data to display  EKG None  Radiology DG Chest 2 View  Result Date: 04/25/2022 CLINICAL DATA:  Back pain after coughing.  Muscle spasms for 3 days. EXAM: CHEST - 2 VIEW COMPARISON:  Two-view chest x-ray 11/19/2019 FINDINGS: Heart size is normal. The lungs are clear. No edema or effusion is present. Mild degenerative changes of the thoracic spine are stable. Visualized soft tissues and bony thorax are otherwise unremarkable. IMPRESSION: No active cardiopulmonary disease. Electronically Signed   By: San Morelle M.D.   On: 04/25/2022 11:24    Procedures Procedures    Medications Ordered in ED Medications  acetaminophen (TYLENOL) tablet 1,000 mg (1,000 mg Oral Given 04/25/22 1113)  ketorolac (TORADOL) 15 MG/ML injection 15 mg (15 mg Intramuscular Given 04/25/22 1113)  oxyCODONE (Oxy IR/ROXICODONE) immediate release tablet 5 mg (5 mg Oral  Given 04/25/22 1112)    ED Course/ Medical Decision Making/ A&P                             Medical Decision Making Amount and/or Complexity of Data Reviewed Radiology: ordered.  Risk OTC drugs. Prescription drug management.   62 yo M with chief complaints of thoracic back pain.  Sounds musculoskeletal by history of is complicated by language barrier, patient also been having some coughing.  Will obtain a chest x-ray to assess for possible pneumonia or pneumothorax.  Treat pain here.  Reassess.  Plain film of the chest independently interpreted by me without focal infiltrate or pneumothorax.  Will discharge home.  Treat as musculoskeletal.  PCP follow-up.  11:46 AM:  I have discussed the diagnosis/risks/treatment options with the patient and family.  Evaluation and diagnostic testing in the emergency department does not suggest an emergent condition requiring admission or immediate intervention beyond what has been performed at this time.  They will follow up with PCP. We also discussed returning to the ED immediately if new or worsening sx occur. We discussed the sx which are most concerning (e.g., sudden worsening pain, fever, inability to tolerate by mouth) that necessitate immediate return. Medications administered to the patient during their visit and any new prescriptions provided to the patient are listed below.  Medications given during this visit Medications  acetaminophen (TYLENOL) tablet 1,000 mg (1,000 mg Oral Given 04/25/22 1113)  ketorolac (TORADOL) 15 MG/ML injection 15 mg (15 mg Intramuscular Given 04/25/22 1113)  oxyCODONE (Oxy IR/ROXICODONE) immediate release tablet 5 mg (5 mg Oral Given 04/25/22 1112)     The patient appears reasonably screen and/or stabilized for discharge and I doubt any other medical condition or other [facility] requiring further screening, evaluation, or treatment in the ED at this time prior to discharge.          Final Clinical Impression(s) / ED  Diagnoses Final diagnoses:  Acute bilateral thoracic back pain    Rx / DC Orders ED Discharge Orders          Ordered    methocarbamol (ROBAXIN) 500 MG tablet  2 times daily        04/25/22 Chester, Vicie Cech, DO 04/25/22 1146

## 2022-10-21 DIAGNOSIS — M25532 Pain in left wrist: Secondary | ICD-10-CM | POA: Diagnosis not present

## 2022-10-21 DIAGNOSIS — S63502A Unspecified sprain of left wrist, initial encounter: Secondary | ICD-10-CM | POA: Diagnosis not present

## 2022-10-21 DIAGNOSIS — S8011XA Contusion of right lower leg, initial encounter: Secondary | ICD-10-CM | POA: Diagnosis not present

## 2022-10-21 DIAGNOSIS — M25561 Pain in right knee: Secondary | ICD-10-CM | POA: Diagnosis not present

## 2022-10-25 ENCOUNTER — Telehealth: Payer: Self-pay

## 2022-10-25 NOTE — Telephone Encounter (Signed)
Patient called into the office stating that he needs to be seen today. He stated he fell of a ladder and would like some pain meds. The first available is aug 8th. I explained that I would send a message and see if he can be seen sooner.   Pt callback [phone]

## 2022-10-26 ENCOUNTER — Encounter: Payer: Self-pay | Admitting: Orthopedic Surgery

## 2022-10-26 ENCOUNTER — Other Ambulatory Visit (INDEPENDENT_AMBULATORY_CARE_PROVIDER_SITE_OTHER): Payer: 59

## 2022-10-26 ENCOUNTER — Ambulatory Visit: Payer: 59 | Admitting: Orthopedic Surgery

## 2022-10-26 DIAGNOSIS — M79671 Pain in right foot: Secondary | ICD-10-CM | POA: Diagnosis not present

## 2022-10-26 NOTE — Progress Notes (Signed)
Office Visit Note   Patient: Neil Padilla           Date of Birth: Nov 09, 1960           MRN: [id number] Visit Date: 10/26/2022              Requested by: Lewis Moccasin, MD [address] [place],  [place] [address] PCP: Lewis Moccasin, MD  Chief Complaint  Patient presents with   Right Ankle - Pain    DOI 10/20/2022       HPI: Patient is a 62 year old gentleman who states he fell off a ladder.  Patient states he struck his head left wrist right knee.  Patient presents at this time with ecchymosis and bruising medially to the right calcaneus.  Assessment & Plan: Visit Diagnoses:  1. Pain in right foot     Plan: Patient will continue with his diclofenac 75 mg twice daily as needed for pain.  Anticipate this should resolve uneventfully.  Follow-Up Instructions: Return if symptoms worsen or fail to improve.   Ortho Exam  Patient is alert, oriented, no adenopathy, well-dressed, normal affect, normal respiratory effort. Examination of the left wrist it is not tender to palpation there is no ecchymosis or bruising.  Radiographs from [facility] show no evidence of a wrist fracture on the left.  Left knee he has no tenderness to palpation over the patella medial lateral joint lines are nontender to palpation there is no effusion.  He has some bruising over the tibial tubercle and this is minimally tender to palpation.  Examination of the right foot he has good ankle and subtalar motion there is ecchymosis and bruising medially in the hindfoot however the calcaneus is not tender with lateral compression.  The ankle is nontender with compression.  Radiograph shows no fractures.  The calf is soft nontender no evidence of a compartment syndrome.  Imaging: XR Os Calcis Right  Result Date: 10/26/2022 2 view radiographs of the right hindfoot shows no evidence of a calcaneal fracture.  No ankle fracture no talar fracture.  No images are attached to the encounter.  Labs: Lab Results   Component Value Date   REPTSTATUS 12/03/2017 FINAL 11/27/2017   CULT  11/27/2017    NO GROWTH 5 DAYS Performed at [facility], [address] [place], [place] [address]      Lab Results  Component Value Date   ALBUMIN 4.2 11/27/2017   ALBUMIN 4.1 12/21/2011    No results found for: "MG" No results found for: "VD25OH"  No results found for: "PREALBUMIN"    Latest Ref Rng & Units 11/24/2019    2:33 AM 11/27/2017   11:18 PM 11/27/2017   10:26 AM  CBC EXTENDED  WBC 4.0 - 10.5 K/uL 4.1  15.1  13.2   RBC 4.22 - 5.81 MIL/uL 4.95  4.80  4.66   Hemoglobin 13.0 - 17.0 g/dL 82.9  56.2  13.0   HCT 39.0 - 52.0 % 43.3  41.7  42.0   Platelets 150 - 400 K/uL 207  341  306   NEUT# 1.7 - 7.7 K/uL 2.9  10.6  9.0   Lymph# 0.7 - 4.0 K/uL 0.6  2.4  2.2      There is no height or weight on file to calculate BMI.  Orders:  Orders Placed This Encounter  Procedures   XR Os Calcis Right   No orders of the defined types were placed in this encounter.  Procedures: No procedures performed  Clinical Data: No additional findings.  ROS:  All other systems negative, except as noted in the HPI. Review of Systems  Objective: Vital Signs: There were no vitals taken for this visit.  Specialty Comments:  No specialty comments available.  PMFS History: Patient Active Problem List   Diagnosis Date Noted   Sleep apnea 12/21/2021   Snoring 12/21/2021   Nasal congestion 12/21/2021   COLONIC POLYPS, HX OF 12/26/2006   Past Medical History:  Diagnosis Date   Kidney stone     Family History  Family history unknown: Yes    History reviewed. No pertinent surgical history. Social History   Occupational History   Not on file  Tobacco Use   Smoking status: Never   Smokeless tobacco: Not on file  Substance and Sexual Activity   Alcohol use: No   Drug use: No   Sexual activity: Yes

## 2022-11-15 DIAGNOSIS — Z125 Encounter for screening for malignant neoplasm of prostate: Secondary | ICD-10-CM | POA: Diagnosis not present

## 2022-11-15 DIAGNOSIS — Z114 Encounter for screening for human immunodeficiency virus [HIV]: Secondary | ICD-10-CM | POA: Diagnosis not present

## 2022-11-15 DIAGNOSIS — Z13220 Encounter for screening for lipoid disorders: Secondary | ICD-10-CM | POA: Diagnosis not present

## 2022-11-15 DIAGNOSIS — R7309 Other abnormal glucose: Secondary | ICD-10-CM | POA: Diagnosis not present

## 2022-11-15 DIAGNOSIS — Z0000 Encounter for general adult medical examination without abnormal findings: Secondary | ICD-10-CM | POA: Diagnosis not present

## 2023-02-01 IMAGING — MR MR SHOULDER*R* W/O CM
5 of 6 series · 33 of 40 positions shown · non-contrast
Comparison: X-ray shoulder 10/14/2020

CLINICAL DATA: Patient complains of right shoulder pain due to
pulling logs and reports decreased range of motion x 3 months.
Patient reports previous steroid injections x 3 month with minimal
relief. Patient denies history of surgery or cancer

EXAM:
MRI OF THE RIGHT SHOULDER WITHOUT CONTRAST
TECHNIQUE: Multiplanar, multisequence MR imaging of the shoulder was performed.
No intravenous contrast was administered.

[Series 6: T2 fat-sat · axial · right · 3.0mm · 0.59mm/px · z∈[-66,+57]mm · 7 of 34 slices shown (1 of 4)]
[im 1/34]
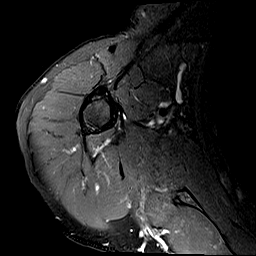
[im 6/34]
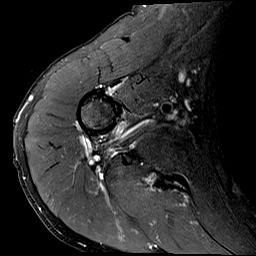
[im 12/34]
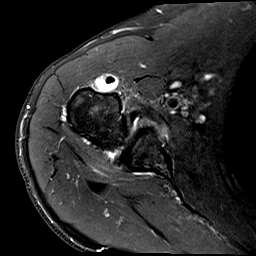
[im 17/34]
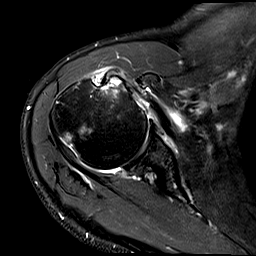
[im 23/34]
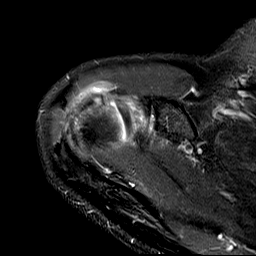
[im 28/34]
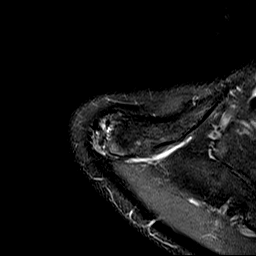
[im 34/34]
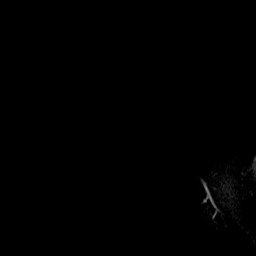

[Series 7: T2 fat-sat · oblique · right · 4.0mm · 0.29mm/px · 6 of 27 slices shown (2 of 4)]
[im 1/27]
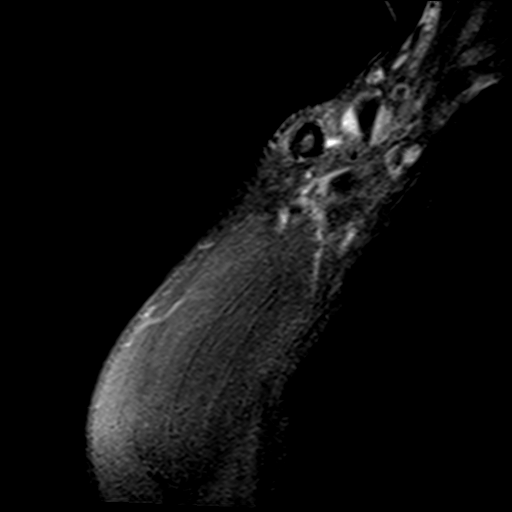
[im 6/27]
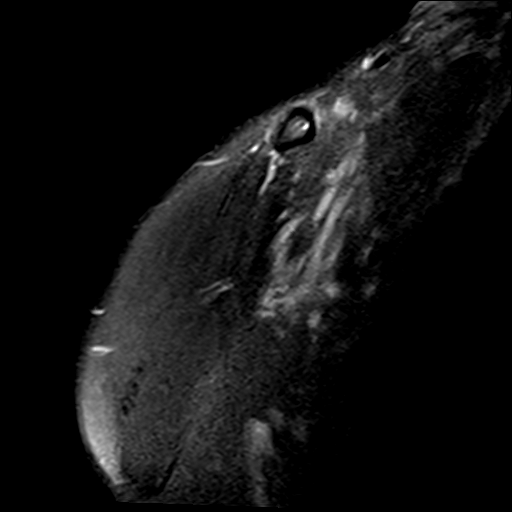
[im 11/27]
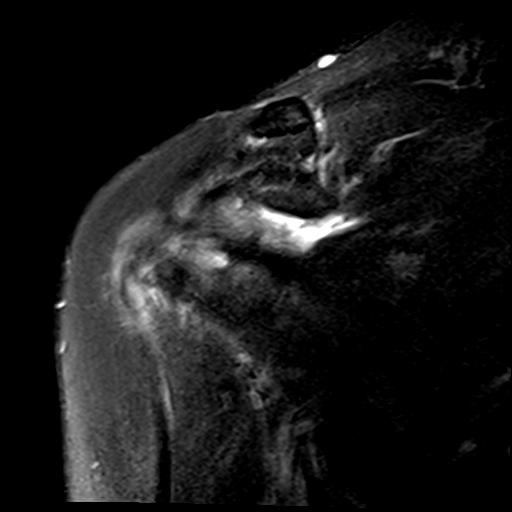
[im 16/27]
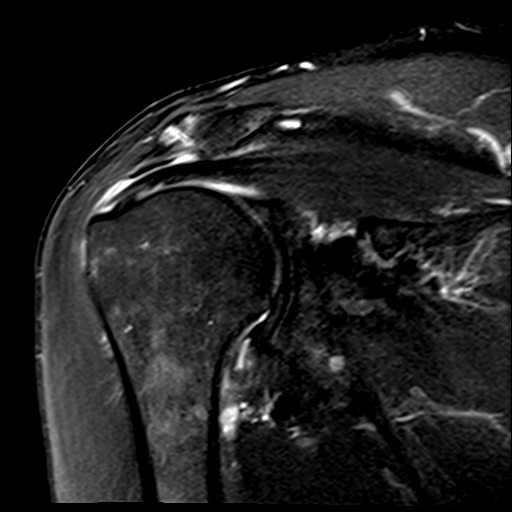
[im 21/27]
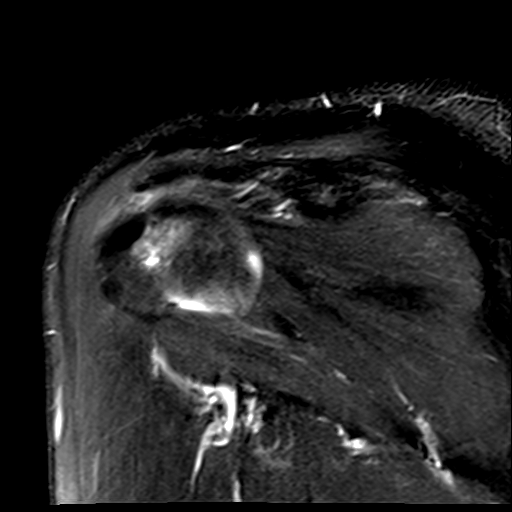
[im 27/27]
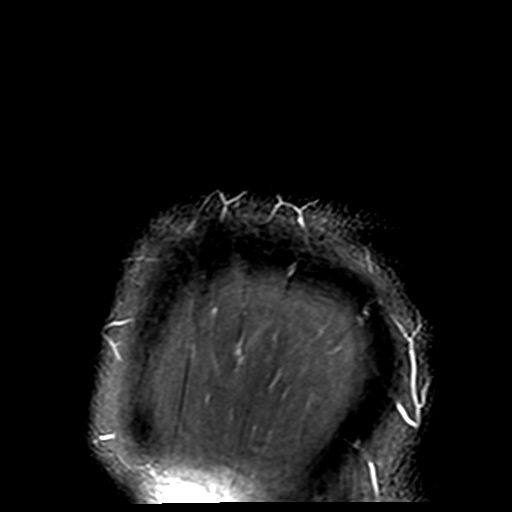

[Series 8: PD · oblique · right · 4.0mm · 0.29mm/px · 6 of 27 slices shown]
[im 1/27]
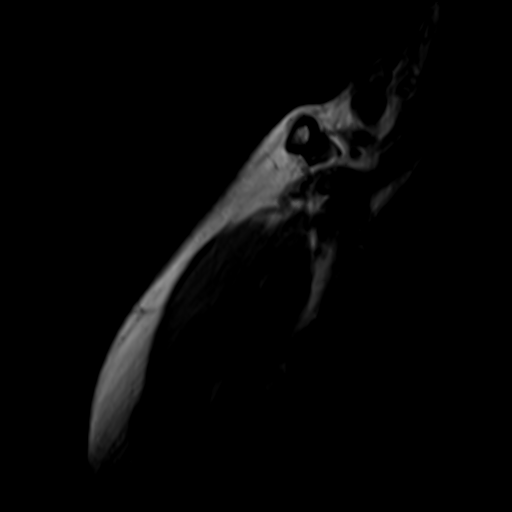
[im 6/27]
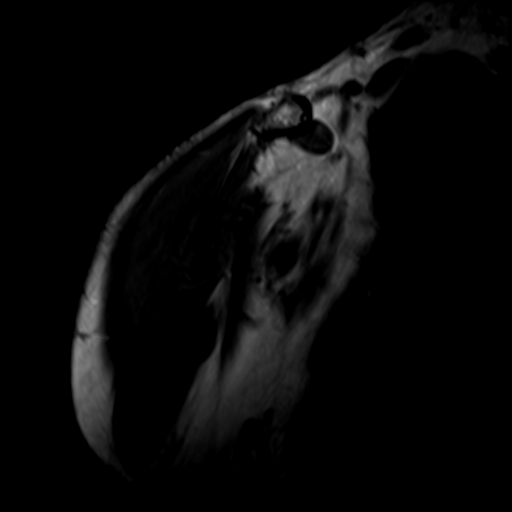
[im 11/27]
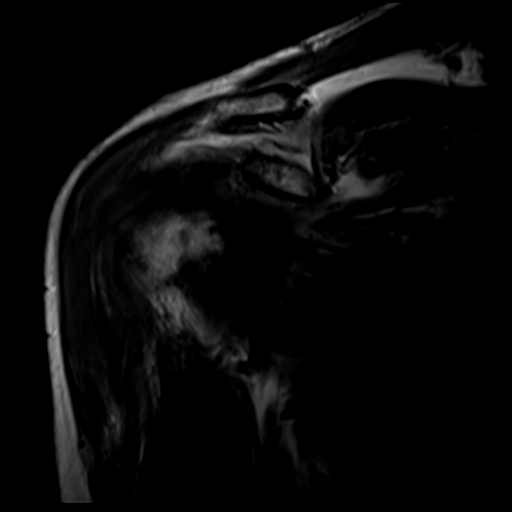
[im 16/27]
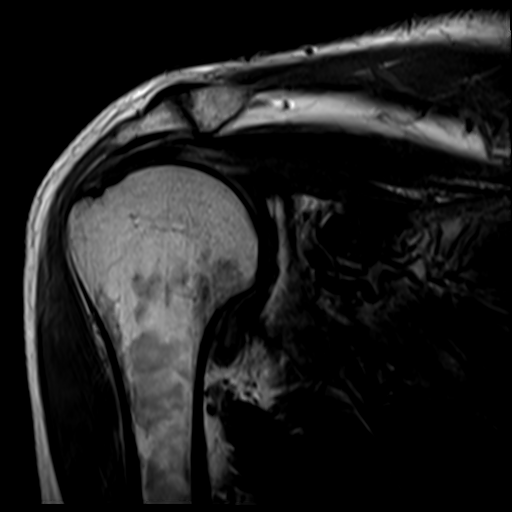
[im 21/27]
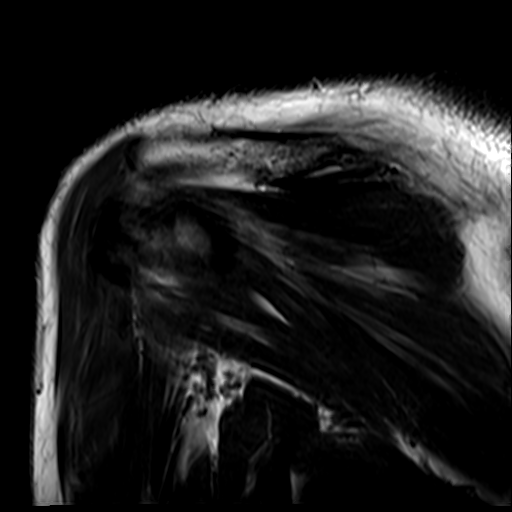
[im 27/27]
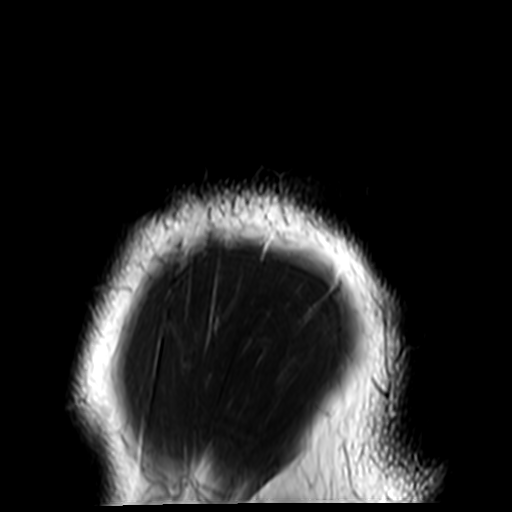

[Series 9: T2 fat-sat · oblique · right · 4.0mm · 0.62mm/px · 7 of 31 slices shown (3 of 4)]
[im 1/31]
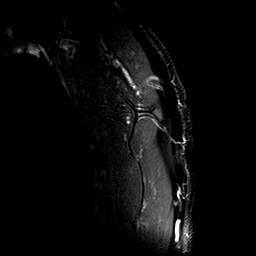
[im 6/31]
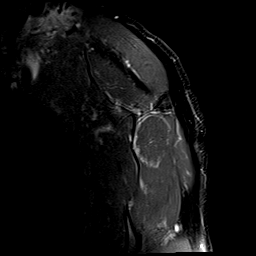
[im 11/31]
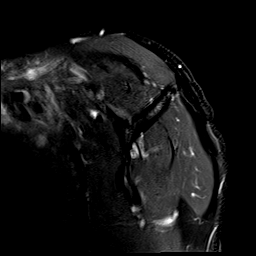
[im 16/31]
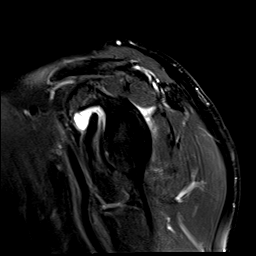
[im 21/31]
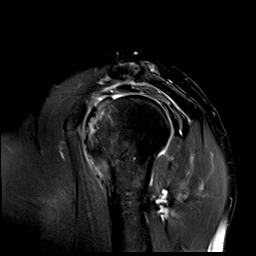
[im 26/31]
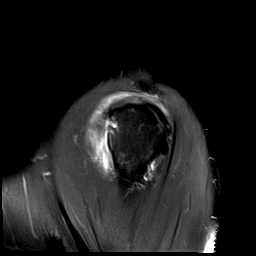
[im 31/31]
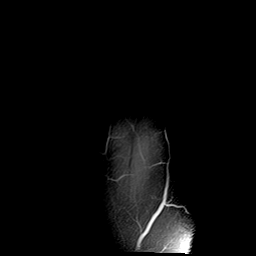

[Series 11: T2 fat-sat · oblique · right · 4.0mm · 0.62mm/px · 7 of 31 slices shown (4 of 4)]
[im 1/31]
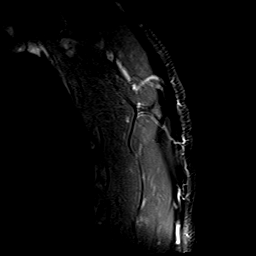
[im 6/31]
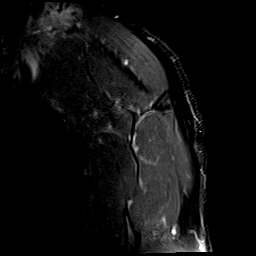
[im 11/31]
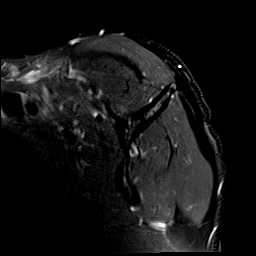
[im 16/31]
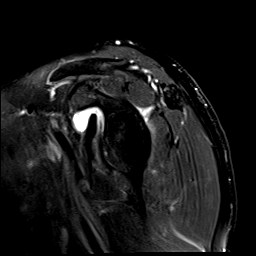
[im 21/31]
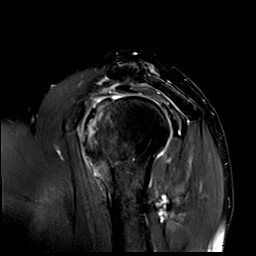
[im 26/31]
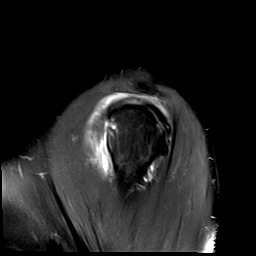
[im 31/31]
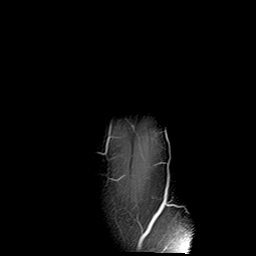

[33 of 40 positions shown; findings below may reference images not displayed]

FINDINGS: Technical Note: Despite efforts by the technologist and patient,
motion artifact is present on today's exam and could not be
eliminated. This reduces exam sensitivity and specificity.

Rotator cuff: Mild supraspinatus tendinosis with 8 mm rim rent tear
along the mid to posterior tendon insertion. Moderate subscapularis
tendinosis with partial-thickness fraying/irregularity along the
cranial aspect of the distal tendon. Mild infraspinatus tendinosis
without tear. Intact teres minor. No full-thickness or retracted
rotator cuff tear.

Muscles: Incidental note of a 2.1 x 0.7 x 1.0 cm intramuscular
lipoma within the infraspinatus muscle belly. No rotator cuff muscle
atrophy.

Biceps long head: Extensive tendinosis and interstitial tearing of
the long head biceps tendon, most pronounced at the groove entry
zone. Mild tenosynovitis.

Acromioclavicular Joint: Mild arthropathy of the AC joint. Trace
subacromial-subdeltoid bursal fluid.

Glenohumeral Joint: No cartilage defect. No significant joint
effusion.

Labrum: Superior labrum appears degenerated. No well-defined tear on
non-arthrographic imaging.

Bones: Reactive subcortical marrow signal changes at the lesser
tuberosity. No acute fracture. No dislocation. No suspicious bone
lesion.

Other: None.
IMPRESSION: 1. Mild supraspinatus tendinosis with 8 mm rim rent tear.
2. Moderate subscapularis tendinosis with partial-thickness
fraying/irregularity of the distal tendon.
3. Extensive tendinosis and interstitial tearing of the long head
biceps tendon, most pronounced at the groove entry zone.
4. Superior labrum appears degenerated.
5. Mild AC joint arthropathy.

## 2023-07-19 ENCOUNTER — Other Ambulatory Visit: Payer: Self-pay

## 2023-07-19 ENCOUNTER — Emergency Department (HOSPITAL_BASED_OUTPATIENT_CLINIC_OR_DEPARTMENT_OTHER)
Admission: EM | Admit: 2023-07-19 | Discharge: 2023-07-20 | Disposition: A | Discharge status: Home / Self Care | Attending: Emergency Medicine | Admitting: Emergency Medicine

## 2023-07-19 ENCOUNTER — Encounter (HOSPITAL_BASED_OUTPATIENT_CLINIC_OR_DEPARTMENT_OTHER): Payer: Self-pay | Admitting: Emergency Medicine

## 2023-07-19 ENCOUNTER — Emergency Department (HOSPITAL_BASED_OUTPATIENT_CLINIC_OR_DEPARTMENT_OTHER): Admitting: Radiology

## 2023-07-19 DIAGNOSIS — D72829 Elevated white blood cell count, unspecified: Secondary | ICD-10-CM | POA: Diagnosis not present

## 2023-07-19 DIAGNOSIS — J069 Acute upper respiratory infection, unspecified: Secondary | ICD-10-CM | POA: Insufficient documentation

## 2023-07-19 DIAGNOSIS — R918 Other nonspecific abnormal finding of lung field: Secondary | ICD-10-CM

## 2023-07-19 DIAGNOSIS — R911 Solitary pulmonary nodule: Secondary | ICD-10-CM | POA: Insufficient documentation

## 2023-07-19 DIAGNOSIS — R059 Cough, unspecified: Secondary | ICD-10-CM | POA: Diagnosis present

## 2023-07-19 DIAGNOSIS — R000 Tachycardia, unspecified: Secondary | ICD-10-CM | POA: Insufficient documentation

## 2023-07-19 LAB — CBC WITH DIFFERENTIAL/PLATELET
Abs Immature Granulocytes: 0.11 10*3/uL — ABNORMAL HIGH (ref 0.00–0.07)
Basophils Absolute: 0.1 10*3/uL (ref 0.0–0.1)
Basophils Relative: 1 %
Eosinophils Absolute: 0.1 10*3/uL (ref 0.0–0.5)
Eosinophils Relative: 0 %
HCT: 44.5 % (ref 39.0–52.0)
Hemoglobin: 15.1 g/dL (ref 13.0–17.0)
Immature Granulocytes: 1 %
Lymphocytes Relative: 10 %
Lymphs Abs: 1.5 10*3/uL (ref 0.7–4.0)
MCH: 29.8 pg (ref 26.0–34.0)
MCHC: 33.9 g/dL (ref 30.0–36.0)
MCV: 87.8 fL (ref 80.0–100.0)
Monocytes Absolute: 1.3 10*3/uL — ABNORMAL HIGH (ref 0.1–1.0)
Monocytes Relative: 9 %
Neutro Abs: 12.3 10*3/uL — ABNORMAL HIGH (ref 1.7–7.7)
Neutrophils Relative %: 79 %
Platelets: 341 10*3/uL (ref 150–400)
RBC: 5.07 MIL/uL (ref 4.22–5.81)
RDW: 13.8 % (ref 11.5–15.5)
WBC: 15.3 10*3/uL — ABNORMAL HIGH (ref 4.0–10.5)
nRBC: 0 % (ref 0.0–0.2)

## 2023-07-19 LAB — BASIC METABOLIC PANEL WITH GFR
Anion gap: 13 (ref 5–15)
BUN: 11 mg/dL (ref 8–23)
CO2: 22 mmol/L (ref 22–32)
Calcium: 9.4 mg/dL (ref 8.9–10.3)
Chloride: 103 mmol/L (ref 98–111)
Creatinine, Ser: 0.85 mg/dL (ref 0.61–1.24)
GFR, Estimated: >60 mL/min (ref >60)
Glucose, Bld: 192 mg/dL — ABNORMAL HIGH (ref 70–99)
Potassium: 3.7 mmol/L (ref 3.5–5.1)
Sodium: 138 mmol/L (ref 135–145)

## 2023-07-19 LAB — RESP PANEL BY RT-PCR (RSV, FLU A&B, COVID)  RVPGX2
Influenza A by PCR: NEGATIVE
Influenza B by PCR: NEGATIVE
Resp Syncytial Virus by PCR: NEGATIVE
SARS Coronavirus 2 by RT PCR: NEGATIVE

## 2023-07-19 NOTE — ED Triage Notes (Signed)
 Cough, chills, fever t 101 at home Started 2 days ago, worse today

## 2023-07-20 ENCOUNTER — Emergency Department (HOSPITAL_BASED_OUTPATIENT_CLINIC_OR_DEPARTMENT_OTHER)

## 2023-07-20 MED ORDER — PREDNISONE 10 MG PO TABS
40.0000 mg | ORAL_TABLET | Freq: Every day | ORAL | 0 refills | Status: AC
Start: 2023-07-20 — End: 2023-07-25

## 2023-07-20 MED ORDER — IOHEXOL 350 MG/ML SOLN
75.0000 mL | Freq: Once | INTRAVENOUS | Status: AC | PRN
  Administered 2023-07-20: 75 mL via INTRAVENOUS

## 2023-07-20 MED ORDER — AZITHROMYCIN 250 MG PO TABS: 250.0000 mg | ORAL_TABLET | Freq: Every day | ORAL | 0 refills | Status: AC

## 2023-07-20 MED ORDER — BENZONATATE 100 MG PO CAPS: 100.0000 mg | ORAL_CAPSULE | Freq: Three times a day (TID) | ORAL | 0 refills | Status: AC

## 2023-07-20 NOTE — ED Notes (Signed)
 ED Provider at bedside.

## 2023-07-20 NOTE — ED Notes (Signed)
 Patient ambulated in hallway with oxygen saturation remaining above 97%.

## 2023-07-20 NOTE — ED Provider Notes (Signed)
 [place] EMERGENCY DEPARTMENT AT [facility] Provider Note   CSN: [id number] Arrival date & time: 07/19/23  1905     History  Chief Complaint  Patient presents with   Cough    Neil Padilla is a 63 y.o. male.   Cough Associated symptoms: chills and fever      24 old male with medical history significant for nephrolithiasis who presents to the emergency department roughly 2 days of productive cough, fever to 101, chills and rigors at home.  He denies any chest discomfort, states that he has been feeling ill and has been fatigued over that same amount of time.  No known sick contacts, no abdominal pain nausea or vomiting.  He had sweats yesterday when he was attempting to help his son work on a car outside.  He denies any history of smoking. States that he took a single dose of amoxicillin  earlier today that his wife gave him.  Home Medications Prior to Admission medications   Medication Sig Start Date End Date Taking? Authorizing Provider  azithromycin  (ZITHROMAX ) 250 MG tablet Take 1 tablet (250 mg total) by mouth daily. Take first 2 tablets together, then 1 every day until finished. 07/20/23  Yes Rosealee Concha, MD  benzonatate  (TESSALON ) 100 MG capsule Take 1 capsule (100 mg total) by mouth every 8 (eight) hours. 07/20/23  Yes Rosealee Concha, MD  predniSONE  (DELTASONE ) 10 MG tablet Take 4 tablets (40 mg total) by mouth daily for 5 days. 07/20/23 07/25/23 Yes Rosealee Concha, MD  acetaminophen  (TYLENOL ) 500 MG tablet Take 1,000 mg by mouth every 6 (six) hours as needed for mild pain.    [provider]  ibuprofen (ADVIL,MOTRIN) 200 MG tablet Take 400 mg by mouth every 6 (six) hours as needed for mild pain.    [provider]  methocarbamol  (ROBAXIN ) 500 MG tablet Take 1 tablet (500 mg total) by mouth 2 (two) times daily. 04/25/22   Albertus Hughs, DO  mometasone  (NASONEX ) 50 MCG/ACT nasal spray Place 2 sprays into the nose daily. 12/21/21   Dohmeier, Raoul Byes, MD   prochlorperazine  (COMPAZINE ) 10 MG tablet Take 1 tablet (10 mg total) by mouth every 6 (six) hours as needed for nausea or vomiting. 11/24/19   Alissa April, MD      Allergies    Patient has no known allergies.    Review of Systems   Review of Systems  Constitutional:  Positive for chills and fever.  Respiratory:  Positive for cough.   All other systems reviewed and are negative.   Physical Exam Updated Vital Signs BP 109/77   Pulse 89   Temp 98.9 F (37.2 C) (Oral)   Resp 19   SpO2 95%  Physical Exam Vitals and nursing note reviewed.  Constitutional:      General: He is not in acute distress.    Appearance: He is well-developed.  HENT:     Head: Normocephalic and atraumatic.  Eyes:     Conjunctiva/sclera: Conjunctivae normal.  Cardiovascular:     Rate and Rhythm: Normal rate and regular rhythm.  Pulmonary:     Effort: Pulmonary effort is normal. No respiratory distress.     Breath sounds: Normal breath sounds.  Abdominal:     Palpations: Abdomen is soft.     Tenderness: There is no abdominal tenderness.  Musculoskeletal:        General: No swelling.     Cervical back: Neck supple.  Skin:    General: Skin is warm and dry.  Capillary Refill: Capillary refill takes less than 2 seconds.  Neurological:     Mental Status: He is alert.  Psychiatric:        Mood and Affect: Mood normal.     ED Results / Procedures / Treatments   Labs (all labs ordered are listed, but only abnormal results are displayed) Labs Reviewed  CBC WITH DIFFERENTIAL/PLATELET - Abnormal; Notable for the following components:      Result Value   WBC 15.3 (*)    Neutro Abs 12.3 (*)    Monocytes Absolute 1.3 (*)    Abs Immature Granulocytes 0.11 (*)    All other components within normal limits  BASIC METABOLIC PANEL WITH GFR - Abnormal; Notable for the following components:   Glucose, Bld 192 (*)    All other components within normal limits  RESP PANEL BY RT-PCR (RSV, FLU A&B, COVID)   [id number]    EKG None  Radiology CT Angio Chest PE W and/or Wo Contrast Result Date: 07/20/2023 CLINICAL DATA:  Suspected pulmonary embolism. EXAM: CT ANGIOGRAPHY CHEST WITH CONTRAST TECHNIQUE: Multidetector CT imaging of the chest was performed using the standard protocol during bolus administration of intravenous contrast. Multiplanar CT image reconstructions and MIPs were obtained to evaluate the vascular anatomy. RADIATION DOSE REDUCTION: This exam was performed according to the departmental dose-optimization program which includes automated exposure control, adjustment of the mA and/or kV according to patient size and/or use of iterative reconstruction technique. CONTRAST:  75mL OMNIPAQUE  IOHEXOL  350 MG/ML SOLN COMPARISON:  None Available. FINDINGS: Cardiovascular: The thoracic aorta is unremarkable. Satisfactory opacification of the pulmonary arteries to the segmental level. No evidence of pulmonary embolism. Normal heart size. No pericardial effusion. Mediastinum/Nodes: No enlarged mediastinal, hilar, or axillary lymph nodes. Thyroid gland, trachea, and esophagus demonstrate no significant findings. Lungs/Pleura: 3 mm and 4 mm lateral right upper lobe pulmonary nodules are seen (axial CT images 68 and 71, CT series 6). 2 mm 4 mm and 5 mm lateral right middle lobe pulmonary nodules are also seen (axial CT images 83, 94 and 95, CT series 6). There is no evidence of acute infiltrate, pleural effusion or pneumothorax. Upper Abdomen: No acute abnormality. Musculoskeletal: No chest wall abnormality. No acute or significant osseous findings. Review of the MIP images confirms the above findings. IMPRESSION: 1. No evidence of pulmonary embolism. 2. Subcentimeter right upper lobe and right middle lobe pulmonary nodules, as described above. No follow-up needed if patient is low-risk (and has no known or suspected primary neoplasm). Non-contrast chest CT can be considered in 12 months if patient is high-risk. This  recommendation follows the consensus statement: Guidelines for Management of Incidental Pulmonary Nodules Detected on CT Images: From the Fleischner Society 2017; Radiology 2017; 284:228-243. Electronically Signed   By: Virgle Grime M.D.   On: 07/20/2023 01:03   DG Chest 2 View Result Date: 07/19/2023 CLINICAL DATA:  Chest pain and cough EXAM: CHEST - 2 VIEW COMPARISON:  Chest x-ray 04/25/2022 FINDINGS: The heart size and mediastinal contours are within normal limits. Both lungs are clear. The visualized skeletal structures are unremarkable. IMPRESSION: No active cardiopulmonary disease. Electronically Signed   By: Tyron Gallon M.D.   On: 07/19/2023 20:06    Procedures Procedures    Medications Ordered in ED Medications  iohexol  (OMNIPAQUE ) 350 MG/ML injection 75 mL (75 mLs Intravenous Contrast Given 07/20/23 0039)    ED Course/ Medical Decision Making/ A&P  Medical Decision Making Amount and/or Complexity of Data Reviewed Labs: ordered. Radiology: ordered.  Risk Prescription drug management.     81 old male with medical history significant for nephrolithiasis who presents to the emergency department roughly 2 days of productive cough, fever to 101, chills and rigors at home.  He denies any chest discomfort, states that he has been feeling ill and has been fatigued over that same amount of time.  No known sick contacts, no abdominal pain nausea or vomiting.  He had sweats yesterday when he was attempting to help his son work on a car outside.  He denies any history of smoking.  States that he took a single dose of amoxicillin  earlier today that his wife gave him.  On arrival, the patient was afebrile, tachycardic heart rate 111, not tachypneic RR 17, BP 131/89, saturating 98% on room air.  On my evaluation, sinus rhythm was noted on cardiac telemetry.  The patient's lungs were clear to auscultation bilaterally.  He presents with a productive cough,  fever and chills.  Concern for viral syndrome, community-acquired pneumonia, PE.  CXR: No acute cardiopulmonary disease.  Negative checks x-ray however symptoms consistent with pneumonia, will evaluate further with CT imaging.  CTA PE: IMPRESSION:  1. No evidence of pulmonary embolism.  2. Subcentimeter right upper lobe and right middle lobe pulmonary  nodules, as described above. No follow-up needed if patient is  low-risk (and has no known or suspected primary neoplasm).  Non-contrast chest CT can be considered in 12 months if patient is  high-risk. This recommendation follows the consensus statement:  Guidelines for Management of Incidental Pulmonary Nodules Detected  on CT Images: From the Fleischner Society 2017; Radiology 2017;  284:228-243.   Labs: CBC with leukocytosis to 15.3, no anemia, BMP unremarkable other than hyperglycemia to 192, COVID, flu, RSV PCR testing negative.  Patient overall well-appearing, normal pulse oximetry, hemodynamically stable.  Will treat for acute bronchitis, advised outpatient follow-up with his primary care provider, stable for discharge.   Final Clinical Impression(s) / ED Diagnoses Final diagnoses:  Upper respiratory tract infection, unspecified type  Pulmonary nodules    Rx / DC Orders ED Discharge Orders          Ordered    benzonatate  (TESSALON ) 100 MG capsule  Every 8 hours        07/20/23 0110    azithromycin  (ZITHROMAX ) 250 MG tablet  Daily        07/20/23 0110    predniSONE  (DELTASONE ) 10 MG tablet  Daily        07/20/23 0110              Rosealee Concha, MD 07/20/23 0111

## 2023-07-20 NOTE — ED Notes (Signed)
 Questions and concerns addressed. Discharge teaching completed.   Prescriptions reviewed and pharmacy verified.   Pt ambulatory upon discharge.

## 2023-07-20 NOTE — Discharge Instructions (Addendum)
 We will treat you for acute bronchitis, you CT was negative for blood clot, large concerning mass, or clear pneumonia: IMPRESSION:  1. No evidence of pulmonary embolism.  2. Subcentimeter right upper lobe and right middle lobe pulmonary  nodules, as described above. No follow-up needed if patient is  low-risk (and has no known or suspected primary neoplasm).  Non-contrast chest CT can be considered in 12 months if patient is  high-risk. This recommendation follows the consensus statement:  Guidelines for Management of Incidental Pulmonary Nodules Detected  on CT Images: From the Fleischner Society 2017; Radiology 2017;  284:228-243.   Follow-up with your pcp to ensure resolution. Your CT scan did reveal small pulmonary nodules which may warrant surveillance outpatient.

## 2023-07-20 NOTE — ED Notes (Signed)
 Patient transported to CT

## 2023-08-17 ENCOUNTER — Other Ambulatory Visit: Payer: Self-pay

## 2023-08-17 ENCOUNTER — Emergency Department (HOSPITAL_BASED_OUTPATIENT_CLINIC_OR_DEPARTMENT_OTHER)
Admission: EM | Admit: 2023-08-17 | Discharge: 2023-08-17 | Disposition: A | Discharge status: Home / Self Care | Attending: Emergency Medicine | Admitting: Emergency Medicine

## 2023-08-17 ENCOUNTER — Encounter (HOSPITAL_BASED_OUTPATIENT_CLINIC_OR_DEPARTMENT_OTHER): Payer: Self-pay

## 2023-08-17 DIAGNOSIS — T1592XA Foreign body on external eye, part unspecified, left eye, initial encounter: Secondary | ICD-10-CM | POA: Insufficient documentation

## 2023-08-17 DIAGNOSIS — Z79899 Other long term (current) drug therapy: Secondary | ICD-10-CM | POA: Insufficient documentation

## 2023-08-17 DIAGNOSIS — X58XXXA Exposure to other specified factors, initial encounter: Secondary | ICD-10-CM | POA: Diagnosis not present

## 2023-08-17 MED ORDER — FLUORESCEIN SODIUM 1 MG OP STRP
1.0000 | ORAL_STRIP | Freq: Once | OPHTHALMIC | Status: AC
  Administered 2023-08-17: 1 via OPHTHALMIC
  Filled 2023-08-17: qty 1

## 2023-08-17 MED ORDER — TETRACAINE HCL 0.5 % OP SOLN
2.0000 [drp] | Freq: Once | OPHTHALMIC | Status: AC
  Administered 2023-08-17: 2 [drp] via OPHTHALMIC
  Filled 2023-08-17: qty 4

## 2023-08-17 MED ORDER — CIPROFLOXACIN HCL 0.3 % OP SOLN: 1.0000 [drp] | OPHTHALMIC | 0 refills | Status: AC

## 2023-08-17 NOTE — Discharge Instructions (Signed)
 If your eye is irritated tomorrow morning, use the eye drops as prescribed and follow up with ophthalmology.  Return to the ER for severe or concerning symptoms.

## 2023-08-17 NOTE — ED Triage Notes (Signed)
 Pt presents with L eye irritation after he believes rubber may have got into his eye while he was using a sander. Denies vision changes.

## 2023-08-17 NOTE — ED Provider Notes (Signed)
 Neil Padilla Provider Note   CSN: [id number] Arrival date & time: 08/17/23  1809     History  Chief Complaint  Patient presents with   Foreign Body in [facility] Neil Padilla is a 63 y.o. male.  63 year old male presents with complaint of foreign body sensation in his left eye.  Patient was using a belt sander to stand rubber on a shoe without his safety glasses today when Padilla got some of the rubber in his eye.  Patient states pain has improved since initial presentation.  Padilla reports his vision to be normal and intact.  Padilla does not wear glasses or contacts.  No other complaints or concerns today.       Home Medications Prior to Admission medications   Medication Sig Start Date End Date Taking? Authorizing Provider  acetaminophen  (TYLENOL ) 500 MG tablet Take 1,000 mg by mouth every 6 (six) hours as needed for mild pain.    [provider]  azithromycin  (ZITHROMAX ) 250 MG tablet Take 1 tablet (250 mg total) by mouth daily. Take first 2 tablets together, then 1 every day until finished. 07/20/23   Rosealee Concha, MD  benzonatate  (TESSALON ) 100 MG capsule Take 1 capsule (100 mg total) by mouth every 8 (eight) hours. 07/20/23   Rosealee Concha, MD  ibuprofen (ADVIL,MOTRIN) 200 MG tablet Take 400 mg by mouth every 6 (six) hours as needed for mild pain.    [provider]  methocarbamol  (ROBAXIN ) 500 MG tablet Take 1 tablet (500 mg total) by mouth 2 (two) times daily. 04/25/22   Albertus Hughs, DO  mometasone  (NASONEX ) 50 MCG/ACT nasal spray Place 2 sprays into the nose daily. 12/21/21   Dohmeier, Raoul Byes, MD  prochlorperazine  (COMPAZINE ) 10 MG tablet Take 1 tablet (10 mg total) by mouth every 6 (six) hours as needed for nausea or vomiting. 11/24/19   Alissa April, MD      Allergies    Oseltamivir phosphate    Review of Systems   Review of Systems Negative except as per HPI Physical Exam Updated Vital Signs BP (!) 108/93 (BP Location:  Right Arm)   Pulse 76   Temp 97.8 F (36.6 C)   Resp 18   Ht 5\' 7"  (1.702 m)   Wt 72.6 kg   SpO2 95%   BMI 25.06 kg/m  Physical Exam Vitals and nursing note reviewed.  Constitutional:      General: Padilla is not in acute distress.    Appearance: Padilla is well-developed. Padilla is not diaphoretic.  HENT:     Head: Normocephalic and atraumatic.  Eyes:     Extraocular Movements: Extraocular movements intact.     Conjunctiva/sclera: Conjunctivae normal.     Right eye: Right conjunctiva is not injected. No chemosis, exudate or hemorrhage.    Left eye: Left conjunctiva is not injected. No chemosis, exudate or hemorrhage.    Pupils: Pupils are equal, round, and reactive to light.     Left eye: No corneal abrasion or fluorescein  uptake. Seidel exam negative.    Slit lamp exam:    Left eye: No corneal ulcer, foreign body, hyphema or photophobia.  Pulmonary:     Effort: Pulmonary effort is normal.  Neurological:     Mental Status: Padilla is alert and oriented to person, place, and time.  Psychiatric:        Behavior: Behavior normal.     ED Results / Procedures / Treatments   Labs (all  labs ordered are listed, but only abnormal results are displayed) Labs Reviewed - No data to display  EKG None  Radiology No results found.  Procedures Procedures    Medications Ordered in ED Medications  tetracaine  (PONTOCAINE) 0.5 % ophthalmic solution 2 drop (2 drops Left Eye Given 08/17/23 1928)  fluorescein  ophthalmic strip 1 strip (1 strip Left Eye Given 08/17/23 1928)    ED Course/ Medical Decision Making/ A&P                                 Medical Decision Making Risk Prescription drug management.   63 year old male presents with concern for foreign body sensation in the left eye as above.  At time of exam, symptoms have significantly improved.  Vision is intact.  Fluorescein  stain applied to left eye, no uptake.  Residual discomfort resolved with the tetracaine  drops.  Padilla did have a  small foreign body located on the conjunctiva which was easily removed with irrigation with saline.  No further foreign bodies appreciated, lids are everted and normal.  Discussed with patient, will send prescription for antibiotic drops, if the eye is irritated tomorrow, can start the drops and follow-up with ophthalmology.  If the eye is normal tomorrow, does not need to start drops.        Final Clinical Impression(s) / ED Diagnoses Final diagnoses:  None    Rx / DC Orders ED Discharge Orders     None         Neil Padilla 08/17/23 1946    Neil Heckle, MD 09/01/23 1206

## 2023-08-17 NOTE — ED Notes (Signed)
 RN reviewed discharge instructions with pt. Pt verbalized understanding and had no further questions. VSS upon discharge.
